# Patient Record
Sex: Male | Born: 1995 | Race: Black or African American | Hispanic: No | Marital: Single
Health system: Southern US, Community
[De-identification: ages and names within clinical notes are randomized; demographics above are authoritative.]

## PROBLEM LIST (undated history)

## (undated) DIAGNOSIS — J45909 Unspecified asthma, uncomplicated: Secondary | ICD-10-CM

## (undated) HISTORY — DX: Unspecified asthma, uncomplicated: J45.909

---
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
## 2016-08-06 ENCOUNTER — Ambulatory Visit (HOSPITAL_COMMUNITY): Admission: EM | Admit: 2016-08-06 | Discharge: 2016-08-06 | Discharge status: Absent without leave | Payer: Self-pay

## 2016-08-07 ENCOUNTER — Emergency Department (HOSPITAL_COMMUNITY)
Admission: EM | Admit: 2016-08-07 | Discharge: 2016-08-07 | Disposition: A | Discharge status: Home / Self Care | Payer: BC Managed Care – PPO | Attending: Emergency Medicine | Admitting: Emergency Medicine

## 2016-08-07 ENCOUNTER — Encounter (HOSPITAL_COMMUNITY): Payer: Self-pay | Admitting: Emergency Medicine

## 2016-08-07 ENCOUNTER — Emergency Department (HOSPITAL_COMMUNITY): Payer: BC Managed Care – PPO

## 2016-08-07 DIAGNOSIS — J45909 Unspecified asthma, uncomplicated: Secondary | ICD-10-CM | POA: Diagnosis not present

## 2016-08-07 DIAGNOSIS — I10 Essential (primary) hypertension: Secondary | ICD-10-CM | POA: Insufficient documentation

## 2016-08-07 DIAGNOSIS — J029 Acute pharyngitis, unspecified: Secondary | ICD-10-CM | POA: Diagnosis present

## 2016-08-07 DIAGNOSIS — Z7722 Contact with and (suspected) exposure to environmental tobacco smoke (acute) (chronic): Secondary | ICD-10-CM | POA: Diagnosis not present

## 2016-08-07 DIAGNOSIS — J039 Acute tonsillitis, unspecified: Secondary | ICD-10-CM | POA: Diagnosis not present

## 2016-08-07 DIAGNOSIS — E039 Hypothyroidism, unspecified: Secondary | ICD-10-CM | POA: Insufficient documentation

## 2016-08-07 DIAGNOSIS — B2790 Infectious mononucleosis, unspecified without complication: Secondary | ICD-10-CM | POA: Insufficient documentation

## 2016-08-07 LAB — BASIC METABOLIC PANEL
ANION GAP: 9 (ref 5–15)
BUN: 9 mg/dL (ref 6–20)
CALCIUM: 9.1 mg/dL (ref 8.9–10.3)
CO2: 23 mmol/L (ref 22–32)
Chloride: 106 mmol/L (ref 101–111)
Creatinine, Ser: 1.1 mg/dL (ref 0.61–1.24)
GLUCOSE: 125 mg/dL — AB (ref 65–99)
POTASSIUM: 4.8 mmol/L (ref 3.5–5.1)
Sodium: 138 mmol/L (ref 135–145)

## 2016-08-07 LAB — CBC WITH DIFFERENTIAL/PLATELET
BASOS ABS: 0.3 10*3/uL — AB (ref 0.0–0.1)
BASOS PCT: 2 %
EOS ABS: 0 10*3/uL (ref 0.0–0.7)
Eosinophils Relative: 0 %
HEMATOCRIT: 40 % (ref 39.0–52.0)
HEMOGLOBIN: 14.3 g/dL (ref 13.0–17.0)
LYMPHS PCT: 40 %
Lymphs Abs: 5.4 10*3/uL — ABNORMAL HIGH (ref 0.7–4.0)
MCH: 31.7 pg (ref 26.0–34.0)
MCHC: 35.8 g/dL (ref 30.0–36.0)
MCV: 88.7 fL (ref 78.0–100.0)
MONOS PCT: 6 %
Monocytes Absolute: 0.8 10*3/uL (ref 0.1–1.0)
NEUTROS PCT: 52 %
Neutro Abs: 6.9 10*3/uL (ref 1.7–7.7)
Platelets: 171 10*3/uL (ref 150–400)
RBC: 4.51 MIL/uL (ref 4.22–5.81)
RDW: 12.1 % (ref 11.5–15.5)
WBC: 13.4 10*3/uL — ABNORMAL HIGH (ref 4.0–10.5)

## 2016-08-07 LAB — I-STAT CHEM 8, ED
BUN: 13 mg/dL (ref 6–20)
CALCIUM ION: 1.15 mmol/L (ref 1.15–1.40)
CHLORIDE: 104 mmol/L (ref 101–111)
Creatinine, Ser: 1.2 mg/dL (ref 0.61–1.24)
GLUCOSE: 121 mg/dL — AB (ref 65–99)
HCT: 43 % (ref 39.0–52.0)
Hemoglobin: 14.6 g/dL (ref 13.0–17.0)
POTASSIUM: 4.4 mmol/L (ref 3.5–5.1)
Sodium: 140 mmol/L (ref 135–145)
TCO2: 25 mmol/L (ref 0–100)

## 2016-08-07 LAB — MONONUCLEOSIS SCREEN: MONO SCREEN: POSITIVE — AB

## 2016-08-07 LAB — RAPID STREP SCREEN (MED CTR MEBANE ONLY): Streptococcus, Group A Screen (Direct): NEGATIVE

## 2016-08-07 MED ORDER — NAPROXEN 500 MG PO TABS: 500.0000 mg | ORAL_TABLET | Freq: Two times a day (BID) | ORAL | 0 refills | Status: DC

## 2016-08-07 MED ORDER — ACETAMINOPHEN 325 MG PO TABS
650.0000 mg | ORAL_TABLET | Freq: Once | ORAL | Status: AC
  Administered 2016-08-07: 650 mg via ORAL
  Filled 2016-08-07: qty 2

## 2016-08-07 MED ORDER — SODIUM CHLORIDE 0.9 % IV BOLUS (SEPSIS)
1000.0000 mL | Freq: Once | INTRAVENOUS | Status: AC
  Administered 2016-08-07: 1000 mL via INTRAVENOUS

## 2016-08-07 MED ORDER — KETOROLAC TROMETHAMINE 30 MG/ML IJ SOLN
30.0000 mg | Freq: Once | INTRAMUSCULAR | Status: AC
  Administered 2016-08-07: 30 mg via INTRAMUSCULAR
  Filled 2016-08-07: qty 1

## 2016-08-07 MED ORDER — DEXAMETHASONE SODIUM PHOSPHATE 10 MG/ML IJ SOLN
10.0000 mg | Freq: Once | INTRAMUSCULAR | Status: AC
  Administered 2016-08-07: 10 mg via INTRAMUSCULAR
  Filled 2016-08-07: qty 1

## 2016-08-07 MED ORDER — IOPAMIDOL (ISOVUE-300) INJECTION 61%
INTRAVENOUS | Status: AC
  Administered 2016-08-07: 72 mL
  Filled 2016-08-07: qty 75

## 2016-08-07 NOTE — ED Triage Notes (Signed)
Pt being treated for tonsillitis one day with antibiotic [facility] and was given steroid. Pt reports difficultly swallowing. Tonsils are enlarged and have white spots on them, pt's airway intact.

## 2016-08-07 NOTE — ED Provider Notes (Signed)
[facility] Provider Note   CSN: [id number] Arrival date & time: 08/07/16  1434     History   Chief Complaint Chief Complaint  Patient presents with  . Sore Throat    HPI Ricky Mckenzie. is a 20 y.o. male.  Ricky Mckenzie. Is a 21 y.o. Male who presents to the ED with his father complaining of sore throat and trouble swallowing for the past week that has worsened today. He reports trouble swallowing and that he has been spitting into a basin. He was seen at urgent care in the past 5 days as well as [facility]. He believes he had a positive Monospot and may be a positive strep test. He is not sure about the exact medications that he received. Later, I was able to speak with the student health center who told me he received Solu-Medrol, Rocephin and a fluid bolus. She reports he had a positive Monospot, she is not sure about his strep test. Patient reports a fever of 101 earlier today. Patient denies neck pain, neck stiffness, ear pain, coughing, trouble breathing, abdominal pain, nausea, vomiting, diarrhea or rashes.   The history is provided by the patient. No language interpreter was used.  Sore Throat  Pertinent negatives include no chest pain, no abdominal pain, no headaches and no shortness of breath.    Past Medical History:  Diagnosis Date  . Asthma 2000    Patient Active Problem List   Diagnosis Date Noted  . 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 Goiter 06/02/2013  . 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 Essential hypertension, benign 06/02/2013    History reviewed. No pertinent surgical history.     Home Medications    Prior to Admission medications   Medication Sig Start Date End Date Taking? Authorizing Provider  loratadine (CLARITIN) 10 MG tablet Take 10 mg by mouth daily.    Historical Provider, MD  montelukast (SINGULAIR) 10 MG tablet Take 10 mg by mouth at bedtime.    Historical Provider, MD  naproxen  (NAPROSYN) 500 MG tablet Take 1 tablet (500 mg total) by mouth 2 (two) times daily with a meal. 08/07/16   Ricky Farrier, PA-C    Family History Family History  Problem Relation Age of Onset  . Hypertension Father   . Diabetes Paternal Grandmother   . Kidney disease Paternal Grandmother   . Diabetes Paternal Grandfather   . Kidney disease Paternal Grandfather   . Thyroid disease Brother     Social History Social History  Substance Use Topics  . Smoking status: Passive Smoke Exposure - Never Smoker  . Smokeless tobacco: Not on file  . Alcohol use Not on file     Allergies   Review of patient's allergies indicates no known allergies.   Review of Systems Review of Systems  Constitutional: Positive for fever.  HENT: Positive for drooling, sore throat, trouble swallowing and voice change. Negative for congestion, ear pain, facial swelling and mouth sores.   Eyes: Negative for visual disturbance.  Respiratory: Negative for cough and shortness of breath.   Cardiovascular: Negative for chest pain.  Gastrointestinal: Negative for abdominal pain, diarrhea, nausea and vomiting.  Genitourinary: Negative for dysuria.  Musculoskeletal: Negative for back pain and neck pain.  Skin: Negative for rash.  Neurological: Negative for syncope, light-headedness and headaches.     Physical Exam Updated Vital Signs BP 126/75 (BP Location: Right Arm)   Pulse 83   Temp 98.9 F (37.2 C) (Oral)  Resp 18   Ht 5\' 11"  (1.803 m)   Wt 86.2 kg   SpO2 98%   BMI 26.50 kg/m   Physical Exam  Constitutional: He appears well-developed and well-nourished. No distress.  Nontoxic appearing.  HENT:  Head: Normocephalic and atraumatic.  Right Ear: External ear normal.  Left Ear: External ear normal.  Mouth/Throat: Oropharyngeal exudate present.  Patient with nearly kissing tonsils. Significant tonsillar hypertrophy with exudates. Uvula is midline without edema. Oropharynx is patent. He is spitting  up some into a basin. No trismus. No evidence of peritonsillar abscess. Bilateral tympanic membranes are pearly-gray without erythema or loss of landmarks.   Eyes: Conjunctivae are normal. Pupils are equal, round, and reactive to light. Right eye exhibits no discharge. Left eye exhibits no discharge.  Neck: Normal range of motion. Neck supple. No JVD present. No tracheal deviation present.  No stridor.  Cardiovascular: Normal rate, regular rhythm, normal heart sounds and intact distal pulses.   Pulmonary/Chest: Effort normal and breath sounds normal. No stridor. No respiratory distress. He has no wheezes. He has no rales.  Abdominal: Soft. There is no tenderness.  Musculoskeletal: He exhibits no edema.  Lymphadenopathy:    He has no cervical adenopathy.  Neurological: He is alert. Coordination normal.  Skin: Skin is warm and dry. Capillary refill takes less than 2 seconds. No rash noted. He is not diaphoretic. No erythema. No pallor.  Psychiatric: He has a normal mood and affect. His behavior is normal.  Nursing note and vitals reviewed.    ED Treatments / Results  Labs (all labs ordered are listed, but only abnormal results are displayed) Labs Reviewed  MONONUCLEOSIS SCREEN - Abnormal; Notable for the following:       Result Value   Mono Screen POSITIVE (*)    All other components within normal limits  BASIC METABOLIC PANEL - Abnormal; Notable for the following:    Glucose, Bld 125 (*)    All other components within normal limits  CBC WITH DIFFERENTIAL/PLATELET - Abnormal; Notable for the following:    WBC 13.4 (*)    Lymphs Abs 5.4 (*)    Basophils Absolute 0.3 (*)    All other components within normal limits  I-STAT CHEM 8, ED - Abnormal; Notable for the following:    Glucose, Bld 121 (*)    All other components within normal limits  RAPID STREP SCREEN (NOT AT [facility])  CULTURE, GROUP A STREP [facility])    EKG  EKG Interpretation None       Radiology Ct Soft Tissue Neck W  Contrast  Result Date: 08/07/2016 CLINICAL DATA:  Throat swelling and pain. Dysphagia. Symptoms beginning four days ago and worsening. EXAM: CT NECK WITH CONTRAST TECHNIQUE: Multidetector CT imaging of the neck was performed using the standard protocol following the bolus administration of intravenous contrast. CONTRAST:  72mL ISOVUE-300 IOPAMIDOL (ISOVUE-300) INJECTION 61% COMPARISON:  None. FINDINGS: Pharynx and larynx: There is prominent symmetric enlargement of the nasopharyngeal soft tissues/ adenoids. There is also prominent symmetric enlargement of the palatine tonsils which demonstrate mildly heterogeneous, striated enhancement and contact one another in the midline. No peritonsillar abscess is identified. There is a small retropharyngeal effusion. The oropharyngeal airway remains patent. The larynx is unremarkable. Salivary glands: Submandibular and parotid glands are unremarkable. Thyroid: Unremarkable. Lymph nodes: Bilateral cervical lymphadenopathy is likely reactive. Right larger than left lateral retropharyngeal lymph nodes measure up to 1.6 cm in short axis. Submental lymph nodes measure up to 10 mm in short axis. Level  II lymph nodes measure up to 1.7 cm on the right and 1.8 cm on the left. There are an increased number of subcentimeter level III-V lymph nodes bilaterally. Vascular: Major vascular structures of the neck appear patent. Limited intracranial: Unremarkable. Visualized orbits: Unremarkable. Mastoids and visualized paranasal sinuses: Mild mucosal thickening in the left greater than right sphenoid sinuses with small amount of fluid/secretions on the left. Small right maxillary sinus mucous retention cyst. Clear mastoid air cells. Skeleton: Unremarkable. Upper chest: Clear lung apices. Partially visualized homogeneous soft tissue in the anterior mediastinum, likely thymus. Other: None. IMPRESSION: 1. Prominent bilateral tonsillar enlargement consistent with tonsillitis. No evidence of  peritonsillar abscess. 2. Small retropharyngeal effusion. 3. Reactive cervical lymphadenopathy. Electronically Signed   By: Sebastian Ache M.D.   On: 08/07/2016 18:02    Procedures Procedures (including critical care time)  Medications Ordered in ED Medications  dexamethasone (DECADRON) injection 10 mg (10 mg Intramuscular Given 08/07/16 1601)  ketorolac (TORADOL) 30 MG/ML injection 30 mg (30 mg Intramuscular Given 08/07/16 1602)  sodium chloride 0.9 % bolus 1,000 mL (0 mLs Intravenous Stopped 08/07/16 1812)  iopamidol (ISOVUE-300) 61 % injection (72 mLs  Contrast Given 08/07/16 1732)  acetaminophen (TYLENOL) tablet 650 mg (650 mg Oral Given 08/07/16 1838)     Initial Impression / Assessment and Plan / ED Course  I have reviewed the triage vital signs and the nursing notes.  Pertinent labs & imaging results that were available during my care of the patient were reviewed by me and considered in my medical decision making (see chart for details).  Clinical Course   This is a 21 y.o. Male who presents to the ED with his father complaining of sore throat and trouble swallowing for the past week that has worsened today. He reports trouble swallowing and that he has been spitting into a basin. He was seen at urgent care in the past 5 days as well as his student [facility] center. He believes he had a positive Monospot and may be a positive strep test. He is not sure about the exact medications that he received. Later, I was able to speak with the student health center who told me he received Solu-Medrol, Rocephin and a fluid bolus. She reports he had a positive Monospot, she is not sure about his strep test. Patient reports a fever of 101 earlier today. On exam the patient is afebrile. He has bilateral tonsillar hypertrophy with exudate. Uvula is midline without edema. He is spitting into a basin. No neck pain or stiffness. No trismus. Will obtain CT soft tissue and provide with medications. The patient  received Decadron and Toradol and he reports he was feeling better almost instantly. At recheck he is no longer drooling. His voice has returned normal. CBC is remarkable for leukocytosis with a white count of 13,000. Rapid strep is negative. Monospot is positive. CT soft tissue neck shows prominent bilateral tonsillar enlargement consistent with tonsillitis. No evidence of peritonsillar abscess. At recheck patient is still feeling much better. He is able to tolerate by mouth Tylenol and ginger ale without difficulty. His voice has returned normal. He has no drooling. He reports feeling much better and ready for discharge.  Will discharge with follow-up with ENT doctor [facility] I encouraged him to use a Cepacol antibacterial mouthwash as well as naproxen for pain control. I discussed the expected course and treatment of mono. I advised no contact sports until cleared back by his primary care doctor. I discussed strict and specific  return precautions. I provided him with a school note and reported that if he is not feeling better by the time he is due to return he should be re-seen in urgent care, or the emergency department. I advised that if any time he has difficulty swallowing or he is having drooling or difficulty moving his neck in its to return immediately to the emergency department. I advised the patient to follow-up with their primary care provider this week. I advised the patient to return to the emergency department with new or worsening symptoms or new concerns. The patient verbalized understanding and agreement with plan.     This patient was discussed with Dr. Jeraldine Loots who agrees with assessment and plan.   Final Clinical Impressions(s) / ED Diagnoses   Final diagnoses:  Tonsillitis  Mononucleosis    New Prescriptions New Prescriptions   NAPROXEN (NAPROSYN) 500 MG TABLET    Take 1 tablet (500 mg total) by mouth 2 (two) times daily with a meal.     Ricky Farrier, PA-C 08/07/16  1853    Gerhard Munch, MD 08/07/16 2352

## 2016-08-07 NOTE — Discharge Instructions (Signed)
Please use Cepacol antibacterial mouth wash twice a day. Swish and spit out.

## 2016-08-09 LAB — CULTURE, GROUP A STREP (THRC)

## 2018-03-07 ENCOUNTER — Ambulatory Visit (HOSPITAL_COMMUNITY)
Admission: EM | Admit: 2018-03-07 | Discharge: 2018-03-07 | Disposition: A | Discharge status: Home / Self Care | Payer: BC Managed Care – PPO | Attending: Family Medicine | Admitting: Family Medicine

## 2018-03-07 ENCOUNTER — Encounter (HOSPITAL_COMMUNITY): Payer: Self-pay | Admitting: Emergency Medicine

## 2018-03-07 DIAGNOSIS — H66002 Acute suppurative otitis media without spontaneous rupture of ear drum, left ear: Secondary | ICD-10-CM

## 2018-03-07 MED ORDER — FLUTICASONE PROPIONATE 50 MCG/ACT NA SUSP: 1.0000 | Freq: Every day | NASAL | 2 refills | Status: AC

## 2018-03-07 MED ORDER — AMOXICILLIN-POT CLAVULANATE 875-125 MG PO TABS: 1.0000 | ORAL_TABLET | Freq: Two times a day (BID) | ORAL | 0 refills | Status: AC

## 2018-03-07 NOTE — ED Triage Notes (Signed)
Pt states both his ears are clogged

## 2018-03-07 NOTE — Discharge Instructions (Signed)
Push fluids to ensure adequate hydration and keep secretions thin.  Tylenol and/or ibuprofen as needed for pain or fevers.  Complete course of antibiotics.  Daily flonase. If symptoms worsen or do not improve in the next week to return to be seen or to follow up with your PCP.

## 2018-03-07 NOTE — ED Provider Notes (Signed)
[facility]    CSN: [id number] Arrival date & time: 03/07/18  1816     History   Chief Complaint Chief Complaint  Patient presents with  . Otalgia    HPI Ricky Isabell. is a 22 y.o. male.   Ricky Mckenzie presents with complaints of head pressure, bilateral ear pain and pressure with difficulty hearing and ringing, productive cough and congestion. This started approximately 4 days ago. No fevers. No known ill contacts. Uses daily allergy medication. Hx of asthma but has not required treatment in years. No gi/gu complaints. No sore throat. Eyes have felt red without drainage.   ROS per HPI.      Past Medical History:  Diagnosis Date  . Asthma 2000    Patient Active Problem List   Diagnosis Date Noted  . 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 Goiter 06/02/2013  . 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 Essential hypertension, benign 06/02/2013    History reviewed. No pertinent surgical history.     Home Medications    Prior to Admission medications   Medication Sig Start Date End Date Taking? Authorizing Provider  citalopram (CELEXA) 10 MG tablet Take 10 mg by mouth daily.   Yes [provider]  amoxicillin-clavulanate (AUGMENTIN) 875-125 MG tablet Take 1 tablet by mouth every 12 (twelve) hours for 7 days. 03/07/18 03/14/18  Georgetta Haber, NP  fluticasone (FLONASE) 50 MCG/ACT nasal spray Place 1 spray into both nostrils daily. 03/07/18   Georgetta Haber, NP  loratadine (CLARITIN) 10 MG tablet Take 10 mg by mouth daily.    [provider]  montelukast (SINGULAIR) 10 MG tablet Take 10 mg by mouth at bedtime.    [provider]  naproxen (NAPROSYN) 500 MG tablet Take 1 tablet (500 mg total) by mouth 2 (two) times daily with a meal. 08/07/16   [name], PA-C    Family History Family History  Problem Relation Age of Onset  . Hypertension Father   . Diabetes Paternal Grandmother   . Kidney disease Paternal  Grandmother   . Diabetes Paternal Grandfather   . Kidney disease Paternal Grandfather   . Thyroid disease Brother     Social History Social History   Tobacco Use  . Smoking status: Passive Smoke Exposure - Never Smoker  Substance Use Topics  . Alcohol use: Not on file  . Drug use: Not on file     Allergies   Patient has no known allergies.   Review of Systems Review of Systems   Physical Exam Triage Vital Signs ED Triage Vitals [03/07/18 1837]  Enc Vitals Group     BP (!) 144/88     Pulse Rate 92     Resp 18     Temp 98.9 F (37.2 C)     Temp src      SpO2 100 %     Weight      Height      Head Circumference      Peak Flow      Pain Score      Pain Loc      Pain Edu?      Excl. in GC?    No data found.  Updated Vital Signs BP (!) 144/88   Pulse 92   Temp 98.9 F (37.2 C)   Resp 18   SpO2 100%    Physical Exam  Constitutional: He is oriented to person, place, and time. He appears well-developed and well-nourished.  HENT:  Head:  Normocephalic and atraumatic.  Right Ear: External ear and ear canal normal. Tympanic membrane is erythematous.  Left Ear: External ear and ear canal normal. Tympanic membrane is erythematous and bulging.  Nose: Nose normal. Right sinus exhibits no maxillary sinus tenderness and no frontal sinus tenderness. Left sinus exhibits no maxillary sinus tenderness and no frontal sinus tenderness.  Mouth/Throat: Uvula is midline, oropharynx is clear and moist and mucous membranes are normal.  Eyes: Pupils are equal, round, and reactive to light. Conjunctivae are normal.  Neck: Normal range of motion.  Cardiovascular: Normal rate and regular rhythm.  Pulmonary/Chest: Effort normal and breath sounds normal.  Lymphadenopathy:    He has no cervical adenopathy.  Neurological: He is alert and oriented to person, place, and time.  Skin: Skin is warm and dry.  Vitals reviewed.    UC Treatments / Results  Labs (all labs ordered are  listed, but only abnormal results are displayed) Labs Reviewed - No data to display  EKG None  Radiology No results found.  Procedures Procedures (including critical care time)  Medications Ordered in UC Medications - No data to display  Initial Impression / Assessment and Plan / UC Course  I have reviewed the triage vital signs and the nursing notes.  Pertinent labs & imaging results that were available during my care of the patient were reviewed by me and considered in my medical decision making (see chart for details).     Left Tm with OM. Right with mild erythema. Course of augmentin initiated. Push fluids. Continue with allergy medications, daily flonase. Return precautions provided. Patient verbalized understanding and agreeable to plan.    Final Clinical Impressions(s) / UC Diagnoses   Final diagnoses:  Acute suppurative otitis media of left ear without spontaneous rupture of tympanic membrane, recurrence not specified     Discharge Instructions     Push fluids to ensure adequate hydration and keep secretions thin.  Tylenol and/or ibuprofen as needed for pain or fevers.  Complete course of antibiotics.  Daily flonase. If symptoms worsen or do not improve in the next week to return to be seen or to follow up with your PCP.      ED Prescriptions    Medication Sig Dispense Auth. Provider   amoxicillin-clavulanate (AUGMENTIN) 875-125 MG tablet Take 1 tablet by mouth every 12 (twelve) hours for 7 days. 14 tablet Tessy Pawelski, Dorene Grebe B, NP   fluticasone (FLONASE) 50 MCG/ACT nasal spray Place 1 spray into both nostrils daily. 16 g Georgetta Haber, NP     Controlled Substance Prescriptions [place] Controlled Substance Registry consulted? Not Applicable   Georgetta Haber, NP 03/07/18 [phone]

## 2018-04-03 ENCOUNTER — Encounter (HOSPITAL_COMMUNITY): Payer: Self-pay | Admitting: Emergency Medicine

## 2018-04-03 ENCOUNTER — Observation Stay (HOSPITAL_COMMUNITY)
Admission: EM | Admit: 2018-04-03 | Discharge: 2018-04-04 | Disposition: A | Discharge status: Home / Self Care | Payer: BC Managed Care – PPO | Attending: Surgery | Admitting: Surgery

## 2018-04-03 ENCOUNTER — Emergency Department (HOSPITAL_COMMUNITY): Payer: BC Managed Care – PPO

## 2018-04-03 ENCOUNTER — Encounter (HOSPITAL_COMMUNITY)
Admission: EM | Disposition: A | Discharge status: Home / Self Care | Payer: Self-pay | Source: Home / Self Care | Attending: Emergency Medicine

## 2018-04-03 ENCOUNTER — Observation Stay (HOSPITAL_COMMUNITY): Payer: BC Managed Care – PPO | Admitting: Anesthesiology

## 2018-04-03 ENCOUNTER — Encounter (HOSPITAL_COMMUNITY): Payer: Self-pay

## 2018-04-03 ENCOUNTER — Ambulatory Visit (HOSPITAL_COMMUNITY)
Admission: EM | Admit: 2018-04-03 | Discharge: 2018-04-03 | Disposition: A | Discharge status: Home / Self Care | Payer: BC Managed Care – PPO | Source: Home / Self Care | Attending: Internal Medicine | Admitting: Internal Medicine

## 2018-04-03 ENCOUNTER — Other Ambulatory Visit: Payer: Self-pay

## 2018-04-03 DIAGNOSIS — R1031 Right lower quadrant pain: Secondary | ICD-10-CM

## 2018-04-03 DIAGNOSIS — K352 Acute appendicitis with generalized peritonitis, without abscess: Principal | ICD-10-CM | POA: Insufficient documentation

## 2018-04-03 DIAGNOSIS — K37 Unspecified appendicitis: Secondary | ICD-10-CM | POA: Diagnosis present

## 2018-04-03 DIAGNOSIS — Z7722 Contact with and (suspected) exposure to environmental tobacco smoke (acute) (chronic): Secondary | ICD-10-CM | POA: Diagnosis not present

## 2018-04-03 DIAGNOSIS — K3580 Unspecified acute appendicitis: Secondary | ICD-10-CM

## 2018-04-03 DIAGNOSIS — Z79899 Other long term (current) drug therapy: Secondary | ICD-10-CM | POA: Diagnosis not present

## 2018-04-03 DIAGNOSIS — J45909 Unspecified asthma, uncomplicated: Secondary | ICD-10-CM | POA: Diagnosis not present

## 2018-04-03 DIAGNOSIS — E039 Hypothyroidism, unspecified: Secondary | ICD-10-CM | POA: Diagnosis not present

## 2018-04-03 HISTORY — PX: APPENDECTOMY: SHX54

## 2018-04-03 HISTORY — PX: LAPAROSCOPIC APPENDECTOMY: SHX408

## 2018-04-03 LAB — URINALYSIS, ROUTINE W REFLEX MICROSCOPIC
Bilirubin Urine: NEGATIVE
GLUCOSE, UA: NEGATIVE mg/dL
HGB URINE DIPSTICK: NEGATIVE
KETONES UR: 5 mg/dL — AB
LEUKOCYTES UA: NEGATIVE
Nitrite: NEGATIVE
PROTEIN: NEGATIVE mg/dL
Specific Gravity, Urine: 1.028 (ref 1.005–1.030)
pH: 5 (ref 5.0–8.0)

## 2018-04-03 LAB — COMPREHENSIVE METABOLIC PANEL
ALBUMIN: 4.6 g/dL (ref 3.5–5.0)
ALK PHOS: 59 U/L (ref 38–126)
ALT: 37 U/L (ref 17–63)
ANION GAP: 11 (ref 5–15)
AST: 27 U/L (ref 15–41)
BILIRUBIN TOTAL: 1.3 mg/dL — AB (ref 0.3–1.2)
BUN: 13 mg/dL (ref 6–20)
CALCIUM: 9.5 mg/dL (ref 8.9–10.3)
CO2: 26 mmol/L (ref 22–32)
Chloride: 101 mmol/L (ref 101–111)
Creatinine, Ser: 1.12 mg/dL (ref 0.61–1.24)
GFR calc Af Amer: >60 mL/min (ref >60)
Glucose, Bld: 87 mg/dL (ref 65–99)
Potassium: 4.1 mmol/L (ref 3.5–5.1)
Sodium: 138 mmol/L (ref 135–145)
TOTAL PROTEIN: 8.5 g/dL — AB (ref 6.5–8.1)

## 2018-04-03 LAB — CBC
HCT: 43.5 % (ref 39.0–52.0)
Hemoglobin: 15.3 g/dL (ref 13.0–17.0)
MCH: 31.5 pg (ref 26.0–34.0)
MCHC: 35.2 g/dL (ref 30.0–36.0)
MCV: 89.7 fL (ref 78.0–100.0)
Platelets: 222 10*3/uL (ref 150–400)
RBC: 4.85 MIL/uL (ref 4.22–5.81)
RDW: 11 % — AB (ref 11.5–15.5)
WBC: 10.8 10*3/uL — ABNORMAL HIGH (ref 4.0–10.5)

## 2018-04-03 LAB — LIPASE, BLOOD: Lipase: 24 U/L (ref 11–51)

## 2018-04-03 SURGERY — APPENDECTOMY, LAPAROSCOPIC
Anesthesia: General | Site: Abdomen

## 2018-04-03 MED ORDER — 0.9 % SODIUM CHLORIDE (POUR BTL) OPTIME
TOPICAL | Status: DC | PRN
  Administered 2018-04-03: 1000 mL

## 2018-04-03 MED ORDER — BUPIVACAINE-EPINEPHRINE 0.25% -1:200000 IJ SOLN
INTRAMUSCULAR | Status: DC | PRN
  Administered 2018-04-03: 50 mL

## 2018-04-03 MED ORDER — LIDOCAINE HCL 4 % MT SOLN
OROMUCOSAL | Status: DC | PRN
  Administered 2018-04-03: 4 mL via TOPICAL

## 2018-04-03 MED ORDER — ROCURONIUM BROMIDE 50 MG/5ML IV SOLN
INTRAVENOUS | Status: AC
  Filled 2018-04-03: qty 2

## 2018-04-03 MED ORDER — SUGAMMADEX SODIUM 500 MG/5ML IV SOLN
INTRAVENOUS | Status: AC
  Filled 2018-04-03: qty 5

## 2018-04-03 MED ORDER — HYDRALAZINE HCL 20 MG/ML IJ SOLN
10.0000 mg | INTRAMUSCULAR | Status: DC | PRN
Start: 2018-04-03 — End: 2018-04-04

## 2018-04-03 MED ORDER — DIPHENHYDRAMINE HCL 50 MG/ML IJ SOLN
INTRAMUSCULAR | Status: AC
  Filled 2018-04-03: qty 1

## 2018-04-03 MED ORDER — ONDANSETRON HCL 4 MG/2ML IJ SOLN
INTRAMUSCULAR | Status: DC | PRN
  Administered 2018-04-03: 4 mg via INTRAVENOUS

## 2018-04-03 MED ORDER — LACTATED RINGERS IV SOLN
INTRAVENOUS | Status: DC | PRN
  Administered 2018-04-03 (×2): via INTRAVENOUS

## 2018-04-03 MED ORDER — DOCUSATE SODIUM 100 MG PO CAPS
100.0000 mg | ORAL_CAPSULE | Freq: Two times a day (BID) | ORAL | Status: DC
  Administered 2018-04-03: 100 mg via ORAL
  Filled 2018-04-03: qty 1

## 2018-04-03 MED ORDER — ARTIFICIAL TEARS OPHTHALMIC OINT
TOPICAL_OINTMENT | OPHTHALMIC | Status: DC | PRN
  Administered 2018-04-03: 1 via OPHTHALMIC

## 2018-04-03 MED ORDER — MIDAZOLAM HCL 2 MG/2ML IJ SOLN
INTRAMUSCULAR | Status: AC
  Filled 2018-04-03: qty 2

## 2018-04-03 MED ORDER — PROPOFOL 10 MG/ML IV BOLUS
INTRAVENOUS | Status: AC
  Filled 2018-04-03: qty 20

## 2018-04-03 MED ORDER — DIPHENHYDRAMINE HCL 50 MG/ML IJ SOLN
INTRAMUSCULAR | Status: DC | PRN
  Administered 2018-04-03: 25 mg via INTRAVENOUS

## 2018-04-03 MED ORDER — SUGAMMADEX SODIUM 200 MG/2ML IV SOLN
INTRAVENOUS | Status: DC | PRN
  Administered 2018-04-03: 300 mg via INTRAVENOUS

## 2018-04-03 MED ORDER — FENTANYL CITRATE (PF) 250 MCG/5ML IJ SOLN
INTRAMUSCULAR | Status: AC
  Filled 2018-04-03: qty 5

## 2018-04-03 MED ORDER — PROPOFOL 10 MG/ML IV BOLUS
INTRAVENOUS | Status: DC | PRN
  Administered 2018-04-03: 200 mg via INTRAVENOUS

## 2018-04-03 MED ORDER — LIDOCAINE HCL (CARDIAC) PF 100 MG/5ML IV SOSY
PREFILLED_SYRINGE | INTRAVENOUS | Status: DC | PRN
  Administered 2018-04-03: 80 mg via INTRAVENOUS

## 2018-04-03 MED ORDER — HYDROMORPHONE HCL 1 MG/ML IJ SOLN: 0.2500 mg | INTRAMUSCULAR | Status: DC | PRN

## 2018-04-03 MED ORDER — METOPROLOL TARTRATE 5 MG/5ML IV SOLN: 5.0000 mg | Freq: Four times a day (QID) | INTRAVENOUS | Status: DC | PRN

## 2018-04-03 MED ORDER — HYDROMORPHONE HCL 1 MG/ML IJ SOLN
INTRAMUSCULAR | Status: AC
  Filled 2018-04-03: qty 1

## 2018-04-03 MED ORDER — SODIUM CHLORIDE 0.9 % IV SOLN
INTRAVENOUS | Status: DC
  Administered 2018-04-03: 125 mL/h via INTRAVENOUS
  Administered 2018-04-04: 06:00:00 via INTRAVENOUS

## 2018-04-03 MED ORDER — BUPIVACAINE-EPINEPHRINE 0.25% -1:200000 IJ SOLN
INTRAMUSCULAR | Status: AC
  Filled 2018-04-03: qty 1

## 2018-04-03 MED ORDER — ONDANSETRON HCL 4 MG/2ML IJ SOLN: 4.0000 mg | Freq: Four times a day (QID) | INTRAMUSCULAR | Status: DC | PRN

## 2018-04-03 MED ORDER — SODIUM CHLORIDE 0.9 % IV SOLN
2.0000 g | Freq: Once | INTRAVENOUS | Status: AC
  Administered 2018-04-03: 2 g via INTRAVENOUS
  Filled 2018-04-03: qty 20

## 2018-04-03 MED ORDER — DIPHENHYDRAMINE HCL 50 MG/ML IJ SOLN: 25.0000 mg | Freq: Four times a day (QID) | INTRAMUSCULAR | Status: DC | PRN

## 2018-04-03 MED ORDER — ACETAMINOPHEN 500 MG PO TABS
1000.0000 mg | ORAL_TABLET | Freq: Four times a day (QID) | ORAL | Status: DC
  Administered 2018-04-03 – 2018-04-04 (×3): 1000 mg via ORAL
  Filled 2018-04-03 (×3): qty 2

## 2018-04-03 MED ORDER — PHENYLEPHRINE 40 MCG/ML (10ML) SYRINGE FOR IV PUSH (FOR BLOOD PRESSURE SUPPORT)
PREFILLED_SYRINGE | INTRAVENOUS | Status: AC
  Filled 2018-04-03: qty 10

## 2018-04-03 MED ORDER — TRAMADOL HCL 50 MG PO TABS
50.0000 mg | ORAL_TABLET | Freq: Four times a day (QID) | ORAL | Status: DC | PRN
  Administered 2018-04-04: 50 mg via ORAL
  Filled 2018-04-03: qty 1

## 2018-04-03 MED ORDER — EPHEDRINE SULFATE 50 MG/ML IJ SOLN
INTRAMUSCULAR | Status: AC
  Filled 2018-04-03: qty 1

## 2018-04-03 MED ORDER — LIDOCAINE 2% (20 MG/ML) 5 ML SYRINGE
INTRAMUSCULAR | Status: AC
  Filled 2018-04-03: qty 10

## 2018-04-03 MED ORDER — KETOROLAC TROMETHAMINE 30 MG/ML IJ SOLN
INTRAMUSCULAR | Status: AC
  Filled 2018-04-03: qty 1

## 2018-04-03 MED ORDER — HYDROMORPHONE HCL 2 MG/ML IJ SOLN: 0.5000 mg | INTRAMUSCULAR | Status: DC | PRN

## 2018-04-03 MED ORDER — SODIUM CHLORIDE 0.9 % IR SOLN
Status: DC | PRN
  Administered 2018-04-03: 1000 mL

## 2018-04-03 MED ORDER — SUGAMMADEX SODIUM 200 MG/2ML IV SOLN
INTRAVENOUS | Status: AC
  Filled 2018-04-03: qty 2

## 2018-04-03 MED ORDER — HYDROMORPHONE HCL 1 MG/ML IJ SOLN
0.5000 mg | INTRAMUSCULAR | Status: DC | PRN
  Administered 2018-04-03: 0.5 mg via INTRAVENOUS

## 2018-04-03 MED ORDER — KETOROLAC TROMETHAMINE 30 MG/ML IJ SOLN: 30.0000 mg | Freq: Four times a day (QID) | INTRAMUSCULAR | Status: DC | PRN

## 2018-04-03 MED ORDER — BISACODYL 10 MG RE SUPP: 10.0000 mg | Freq: Every day | RECTAL | Status: DC | PRN

## 2018-04-03 MED ORDER — MIDAZOLAM HCL 5 MG/5ML IJ SOLN
INTRAMUSCULAR | Status: DC | PRN
  Administered 2018-04-03: 2 mg via INTRAVENOUS

## 2018-04-03 MED ORDER — ONDANSETRON HCL 4 MG/2ML IJ SOLN
INTRAMUSCULAR | Status: AC
  Filled 2018-04-03: qty 2

## 2018-04-03 MED ORDER — SIMETHICONE 80 MG PO CHEW: 40.0000 mg | CHEWABLE_TABLET | Freq: Four times a day (QID) | ORAL | Status: DC | PRN

## 2018-04-03 MED ORDER — ENOXAPARIN SODIUM 40 MG/0.4ML ~~LOC~~ SOLN: 40.0000 mg | SUBCUTANEOUS | Status: DC

## 2018-04-03 MED ORDER — PROMETHAZINE HCL 25 MG/ML IJ SOLN: 6.2500 mg | INTRAMUSCULAR | Status: DC | PRN

## 2018-04-03 MED ORDER — METRONIDAZOLE IN NACL 5-0.79 MG/ML-% IV SOLN
500.0000 mg | Freq: Three times a day (TID) | INTRAVENOUS | Status: DC
  Administered 2018-04-03 – 2018-04-04 (×2): 500 mg via INTRAVENOUS
  Filled 2018-04-03 (×6): qty 100

## 2018-04-03 MED ORDER — ONDANSETRON 4 MG PO TBDP: 4.0000 mg | ORAL_TABLET | Freq: Four times a day (QID) | ORAL | Status: DC | PRN

## 2018-04-03 MED ORDER — METHOCARBAMOL 500 MG PO TABS
500.0000 mg | ORAL_TABLET | Freq: Four times a day (QID) | ORAL | Status: DC | PRN
  Administered 2018-04-03 – 2018-04-04 (×2): 500 mg via ORAL
  Filled 2018-04-03 (×2): qty 1

## 2018-04-03 MED ORDER — SODIUM CHLORIDE 0.9 % IV SOLN
2.0000 g | INTRAVENOUS | Status: DC
  Filled 2018-04-03: qty 20

## 2018-04-03 MED ORDER — KETOROLAC TROMETHAMINE 30 MG/ML IJ SOLN
30.0000 mg | Freq: Four times a day (QID) | INTRAMUSCULAR | Status: DC
  Administered 2018-04-03 – 2018-04-04 (×3): 30 mg via INTRAVENOUS
  Filled 2018-04-03 (×2): qty 1

## 2018-04-03 MED ORDER — ARTIFICIAL TEARS OPHTHALMIC OINT
TOPICAL_OINTMENT | OPHTHALMIC | Status: AC
  Filled 2018-04-03: qty 3.5

## 2018-04-03 MED ORDER — ROCURONIUM BROMIDE 100 MG/10ML IV SOLN
INTRAVENOUS | Status: DC | PRN
  Administered 2018-04-03: 40 mg via INTRAVENOUS

## 2018-04-03 MED ORDER — LACTATED RINGERS IV SOLN
INTRAVENOUS | Status: DC
  Administered 2018-04-03: 18:00:00 via INTRAVENOUS

## 2018-04-03 MED ORDER — DIPHENHYDRAMINE HCL 25 MG PO CAPS
25.0000 mg | ORAL_CAPSULE | Freq: Four times a day (QID) | ORAL | Status: DC | PRN
Start: 2018-04-03 — End: 2018-04-04

## 2018-04-03 MED ORDER — METRONIDAZOLE IN NACL 5-0.79 MG/ML-% IV SOLN
500.0000 mg | Freq: Once | INTRAVENOUS | Status: AC
  Administered 2018-04-03: 500 mg via INTRAVENOUS
  Filled 2018-04-03: qty 100

## 2018-04-03 MED ORDER — SODIUM CHLORIDE 0.9 % IJ SOLN
INTRAMUSCULAR | Status: AC
  Filled 2018-04-03: qty 10

## 2018-04-03 MED ORDER — FENTANYL CITRATE (PF) 100 MCG/2ML IJ SOLN
INTRAMUSCULAR | Status: DC | PRN
  Administered 2018-04-03: 50 ug via INTRAVENOUS
  Administered 2018-04-03: 100 ug via INTRAVENOUS
  Administered 2018-04-03 (×2): 50 ug via INTRAVENOUS

## 2018-04-03 MED ORDER — IOHEXOL 300 MG/ML  SOLN
100.0000 mL | Freq: Once | INTRAMUSCULAR | Status: AC | PRN
  Administered 2018-04-03: 100 mL via INTRAVENOUS

## 2018-04-03 SURGICAL SUPPLY — 43 items
ADH SKN CLS APL DERMABOND .7 (GAUZE/BANDAGES/DRESSINGS) ×1
APPLIER CLIP 5 13 M/L LIGAMAX5 (MISCELLANEOUS)
APR CLP MED LRG 5 ANG JAW (MISCELLANEOUS)
BAG SPEC RTRVL LRG 6X4 10 (ENDOMECHANICALS) ×1
BLADE CLIPPER SURG (BLADE) ×2 IMPLANT
CANISTER SUCT 3000ML PPV (MISCELLANEOUS) ×3 IMPLANT
CHLORAPREP W/TINT 26ML (MISCELLANEOUS) ×3 IMPLANT
CLIP APPLIE 5 13 M/L LIGAMAX5 (MISCELLANEOUS) IMPLANT
COVER SURGICAL LIGHT HANDLE (MISCELLANEOUS) ×3 IMPLANT
CUTTER ENDO LINEAR 45M (STAPLE) ×3 IMPLANT
DERMABOND ADVANCED (GAUZE/BANDAGES/DRESSINGS) ×2
DERMABOND ADVANCED .7 DNX12 (GAUZE/BANDAGES/DRESSINGS) ×1 IMPLANT
DEVICE PMI PUNCTURE CLOSURE (MISCELLANEOUS) ×3 IMPLANT
ELECT REM PT RETURN 9FT ADLT (ELECTROSURGICAL) ×3
ELECTRODE REM PT RTRN 9FT ADLT (ELECTROSURGICAL) ×1 IMPLANT
GLOVE BIO SURGEON STRL SZ 6 (GLOVE) ×3 IMPLANT
GLOVE INDICATOR 6.5 STRL GRN (GLOVE) ×3 IMPLANT
GOWN STRL REUS W/ TWL LRG LVL3 (GOWN DISPOSABLE) ×3 IMPLANT
GOWN STRL REUS W/TWL LRG LVL3 (GOWN DISPOSABLE) ×9
KIT BASIN OR (CUSTOM PROCEDURE TRAY) ×3 IMPLANT
KIT TURNOVER KIT B (KITS) ×3 IMPLANT
NDL INSUFFLATION 14GA 120MM (NEEDLE) ×1 IMPLANT
NEEDLE INSUFFLATION 14GA 120MM (NEEDLE) ×3 IMPLANT
NS IRRIG 1000ML POUR BTL (IV SOLUTION) ×3 IMPLANT
PAD ARMBOARD 7.5X6 YLW CONV (MISCELLANEOUS) ×6 IMPLANT
POUCH SPECIMEN RETRIEVAL 10MM (ENDOMECHANICALS) ×3 IMPLANT
RELOAD 45 VASCULAR/THIN (ENDOMECHANICALS) ×3 IMPLANT
RELOAD STAPLE 45 2.5 WHT GRN (ENDOMECHANICALS) IMPLANT
RELOAD STAPLE 45 3.5 BLU ETS (ENDOMECHANICALS) IMPLANT
RELOAD STAPLE TA45 3.5 REG BLU (ENDOMECHANICALS) IMPLANT
SCISSORS ENDO CVD 5DCS (MISCELLANEOUS) IMPLANT
SET IRRIG TUBING LAPAROSCOPIC (IRRIGATION / IRRIGATOR) ×3 IMPLANT
SHEARS HARMONIC ACE PLUS 36CM (ENDOMECHANICALS) ×2 IMPLANT
SLEEVE ENDOPATH XCEL 5M (ENDOMECHANICALS) ×3 IMPLANT
SPECIMEN JAR SMALL (MISCELLANEOUS) ×3 IMPLANT
SUT MNCRL AB 4-0 PS2 18 (SUTURE) ×3 IMPLANT
TOWEL OR 17X24 6PK STRL BLUE (TOWEL DISPOSABLE) ×3 IMPLANT
TRAY FOLEY CATH SILVER 16FR (SET/KITS/TRAYS/PACK) ×3 IMPLANT
TRAY LAPAROSCOPIC MC (CUSTOM PROCEDURE TRAY) ×3 IMPLANT
TROCAR XCEL 12X100 BLDLESS (ENDOMECHANICALS) ×3 IMPLANT
TROCAR XCEL NON-BLD 5MMX100MML (ENDOMECHANICALS) ×3 IMPLANT
TUBING INSUFFLATION (TUBING) ×3 IMPLANT
WATER STERILE IRR 1000ML POUR (IV SOLUTION) ×3 IMPLANT

## 2018-04-03 NOTE — Op Note (Signed)
Operative Report  Ricky Kaufmannony B Ramseur Jr. 22 y.o. male  [id number]  [id number]  04/03/2018  Surgeon: Berna Buehelsea A Atwood Adcock   Assistant: none  Procedure performed: Laparoscopic Appendectomy  Preop diagnosis: Acute appendicitis - with generalized peritonitis  Post-op diagnosis/intraop findings: Acute appendicitis with purulent fluid in pelvis  Specimens: appendix  EBL: minimal  Complications: none  Description of procedure: After obtaining informed consent the patient was brought to the operating room. Antibiotics were administered. SCD's were applied. General endotracheal anesthesia was initiated and a formal time-out was performed. Foley catheter was inserted. The abdomen was prepped and draped in the usual sterile fashion and the abdomen was entered using an infraumbilical Veress needle and insufflated to 15 mmHg. A 5 mm trocar and camera were then introduced, the abdomen was inspected and there is no evidence of injury from our entry. A suprapubic 5 mm trocar and a left lower quadrant 12 mm trocar were introduced under direct visualization following infiltration with local. The patient was then placed in Trendelenburg and rotated to the left and the small bowel was reflected cephalad. The appendix was visualized: the distal half was dilated and acutely inflamed, there was no perforation but purulence was present around the appendix and pooled in the pelvis. Omental adhesions to the appendix were bluntly removed. The appendix was retracted anteriorly and inferiorly. A window in the mesoappendix was created bluntly at the base of the appendix and a white load linear cutting stapler was used to transect the appendix from the cecum, taking a small cuff of healthy cecum as well. The harmonic scalpel was then used to transect the appendiceal mesentery. Hemostasis was ensured. The appendix was placed in an Endo Catch bag and removed through our 12 mm trocar. The purulent fluid was aspirated and the  right lower quadrant and pelvis were irrigated; the effluent was clear. The small bowel was run from the ileocecal valve proximally several feet and no other abnormality was present. The 12mm trocar site in the left lower quadrant was closed with a 0 vicryl in the fascia under direct visualization using a PMI device. The abdomen was desufflated and all trocars removed. The skin incisions were closed with running subcuticular monocryl and Dermabond. The patient was awakened, extubated and transported to the recovery room in stable condition.   All counts were correct at the completion of the case.

## 2018-04-03 NOTE — ED Notes (Signed)
Patient transported to CT 

## 2018-04-03 NOTE — ED Triage Notes (Addendum)
Pt endorses RLQ pain since yesterday with nausea, no vomiting and no diarrhea. LBM yesterday. VSS. [facility] Also had some dysuria last week that resolved.

## 2018-04-03 NOTE — Discharge Instructions (Addendum)
Recommend further evaluation and treatment at the ED Cannot rule out appendicitis based on patients symptoms  Patient aware and in agreement with this plan Going by private vehicle in stable condition to ED

## 2018-04-03 NOTE — ED Notes (Signed)
Cannot provide UA at this time.

## 2018-04-03 NOTE — Anesthesia Preprocedure Evaluation (Addendum)
Anesthesia Evaluation  Patient identified by MRN, date of birth, ID band Patient awake    Reviewed: Allergy & Precautions, NPO status , Patient's Chart, lab work & pertinent test results  Airway Mallampati: II  TM Distance: >3 FB Neck ROM: Full    Dental  (+) Dental Advisory Given   Pulmonary asthma ,    breath sounds clear to auscultation       Cardiovascular negative cardio ROS   Rhythm:Regular Rate:Normal     Neuro/Psych negative neurological ROS     GI/Hepatic Neg liver ROS, Acute appendicitis   Endo/Other  negative endocrine ROSHypothyroidism   Renal/GU negative Renal ROS     Musculoskeletal   Abdominal   Peds  Hematology negative hematology ROS (+)   Anesthesia Other Findings   Reproductive/Obstetrics                            Lab Results  Component Value Date   WBC 10.8 (H) 04/03/2018   HGB 15.3 04/03/2018   HCT 43.5 04/03/2018   MCV 89.7 04/03/2018   PLT 222 04/03/2018   Lab Results  Component Value Date   CREATININE 1.12 04/03/2018   BUN 13 04/03/2018   NA 138 04/03/2018   K 4.1 04/03/2018   CL 101 04/03/2018   CO2 26 04/03/2018    Anesthesia Physical Anesthesia Plan  ASA: II  Anesthesia Plan: General   Post-op Pain Management:    Induction: Intravenous  PONV Risk Score and Plan: 2 and Ondansetron, Dexamethasone and Treatment may vary due to age or medical condition  Airway Management Planned: Oral ETT  Additional Equipment:   Intra-op Plan:   Post-operative Plan: Extubation in OR  Informed Consent: I have reviewed the patients History and Physical, chart, labs and discussed the procedure including the risks, benefits and alternatives for the proposed anesthesia with the patient or authorized representative who has indicated his/her understanding and acceptance.   Dental advisory given  Plan Discussed with: CRNA, Anesthesiologist and  Surgeon  Anesthesia Plan Comments:        Anesthesia Quick Evaluation

## 2018-04-03 NOTE — Progress Notes (Signed)
Ricky Kaufmannony B Storer Jr. is a 22 y.o. male patient admitted from PACU awake, alert - oriented  X 4 - no acute distress noted.  VSS - Blood pressure 133/86, pulse 74, temperature 98.2 F (36.8 C), temperature source Oral, resp. rate 16, height 6' (1.829 m), weight 83.9 kg (185 lb), SpO2 98 %.    IV in place, occlusive dsg intact without redness.  Surgical incision on abdomen C/D/I. Pt's dad at bedside. Oriented to room.  Will cont to eval and treat per MD orders.  Ricky PorterJosephine M Rashun Grattan, RN 04/03/2018 10:53 PM

## 2018-04-03 NOTE — ED Triage Notes (Signed)
Abdominal pain started yesterday afternoon.  Patient has nausea, no vomiting.  No diarrhea.  Tender all over,but more significantly on right abdomen

## 2018-04-03 NOTE — Anesthesia Procedure Notes (Signed)
Procedure Name: Intubation Date/Time: 04/03/2018 8:13 PM Performed by: Clovis Cao, CRNA Pre-anesthesia Checklist: Patient identified, Emergency Drugs available, Suction available, Patient being monitored and Timeout performed Patient Re-evaluated:Patient Re-evaluated prior to induction Oxygen Delivery Method: Circle system utilized Preoxygenation: Pre-oxygenation with 100% oxygen Induction Type: IV induction Ventilation: Mask ventilation without difficulty Laryngoscope Size: Miller and 2 Grade View: Grade I Tube type: Oral Tube size: 7.5 mm Number of attempts: 1 Airway Equipment and Method: Stylet and LTA kit utilized Placement Confirmation: ETT inserted through vocal cords under direct vision,  positive ETCO2 and breath sounds checked- equal and bilateral Secured at: 23 cm Tube secured with: Tape Dental Injury: Teeth and Oropharynx as per pre-operative assessment

## 2018-04-03 NOTE — ED Provider Notes (Signed)
Patient placed in Quick Look pathway, seen and evaluated   Chief Complaint: RLQ abdominal pain  HPI:   22 year old male presents with RLQ abdominal pain starting yesterday. [facility] to [facility] ED to r/o appendicitis. The pain is sharp. He's had some dysuria over that past week and nausea. Pain is worse with sitting up and walking. No fever, chills, vomiting, diarrhea, constipation, penile discharge or testicular pain. No prior abdominal surgeries. NPO since this morning ~8AM  ROS: +abdominal pain, +nausea  Physical Exam:   Gen: No distress  Neuro: Awake and Alert  Skin: Warm    Focused Exam: Abdomen: Soft, tender in RLQ. +Rovsing, +Rebound   Initiation of care has begun. The patient has been counseled on the process, plan, and necessity for staying for the completion/evaluation, and the remainder of the medical screening examination     Bethel BornGekas, Eymi Lipuma Marie, PA-C 04/03/18 1412    Jacalyn LefevreHaviland, Julie, MD 04/03/18 1551

## 2018-04-03 NOTE — ED Provider Notes (Signed)
[facility]   [id number] 04/03/18 Arrival Time: 1157  SUBJECTIVE:  Ricky Kaufmannony B Maddy Jr. is a 22 y.o. male who presents with complaint of abdominal discomfort that began abruptly yesterday.  Denies a precipitating event, or trauma.  Denies recent travel, strenous activity, eating anything out of the ordinary, or close contacts with similar symptoms.  Pain is diffuse RLQ.  Describes as intermittent and sharp in character.  Has not tried OTC medications.  Worse with movement.  Denies similar symptoms in the past.  Last BM yesterday morning normal for patient. Complains of associated decreased appetite, and nausea.  Denies fever, chills, weight changes, vomiting, chest pain, SOB, diarrhea, constipation, hematochezia, melena, dysuria, difficulty urinating, increased frequency or urgency, flank pain, loss of bowel or bladder function.  No LMP for male patient.  ROS: As per HPI.  Past Medical History:  Diagnosis Date  . Asthma 2000   History reviewed. No pertinent surgical history. No Known Allergies No current facility-administered medications on file prior to encounter.    Current Outpatient Medications on File Prior to Encounter  Medication Sig Dispense Refill  . citalopram (CELEXA) 10 MG tablet Take 10 mg by mouth daily.    . fluticasone (FLONASE) 50 MCG/ACT nasal spray Place 1 spray into both nostrils daily. 16 g 2  . loratadine (CLARITIN) 10 MG tablet Take 10 mg by mouth daily.    . montelukast (SINGULAIR) 10 MG tablet Take 10 mg by mouth at bedtime.    . naproxen (NAPROSYN) 500 MG tablet Take 1 tablet (500 mg total) by mouth 2 (two) times daily with a meal. 30 tablet 0   Social History   Socioeconomic History  . Marital status: Single    Spouse name: Not on file  . Number of children: Not on file  . Years of education: Not on file  . Highest education level: Not on file  Occupational History  . Not on file  Social Needs  . Financial resource strain: Not on file    . Food insecurity:    Worry: Not on file    Inability: Not on file  . Transportation needs:    Medical: Not on file    Non-medical: Not on file  Tobacco Use  . Smoking status: Passive Smoke Exposure - Never Smoker  Substance and Sexual Activity  . Alcohol use: Yes  . Drug use: Never  . Sexual activity: Not on file  Lifestyle  . Physical activity:    Days per week: Not on file    Minutes per session: Not on file  . Stress: Not on file  Relationships  . Social connections:    Talks on phone: Not on file    Gets together: Not on file    Attends religious service: Not on file    Active member of club or organization: Not on file    Attends meetings of clubs or organizations: Not on file    Relationship status: Not on file  . Intimate partner violence:    Fear of current or ex partner: Not on file    Emotionally abused: Not on file    Physically abused: Not on file    Forced sexual activity: Not on file  Other Topics Concern  . Not on file  Social History Narrative   Lives with dad and two sibblings attends Early College at [employer] will start 11th grade.   Family History  Problem Relation Age of Onset  . Hypertension Father   .  Diabetes Paternal Grandmother   . Kidney disease Paternal Grandmother   . Diabetes Paternal Grandfather   . Kidney disease Paternal Grandfather   . Thyroid disease Brother      OBJECTIVE:  Vitals:   04/03/18 1303  BP: 132/82  Pulse: 65  Resp: 18  Temp: 98.9 F (37.2 C)  TempSrc: Oral  SpO2: 99%    General appearance: AOx3 in no acute distress HEENT: NCAT.  Oropharynx clear.  Lungs: clear to auscultation bilaterally without adventitious breath sounds Heart: regular rate and rhythm.  Radial pulses 2+ symmetrical bilaterally Abdomen: soft, non-distended; normal active bowel sounds; tender to deep palpation about the RLQ and suprapubic region; TENDER at McBurney's point; negative Murphy's sign; negative rebound; guarding with RLQ  palpation Back: no CVA tenderness Extremities: no edema; symmetrical with no gross deformities Skin: warm and dry Neurologic: normal gait Psychological: alert and cooperative; normal mood and affect  ASSESSMENT & PLAN:  1. Right lower quadrant abdominal pain     Recommend further evaluation and treatment at the ED Cannot rule out appendicitis based on patients symptoms  Patient aware and in agreement with this plan Going by private vehicle in stable condition to ED   Reviewed expectations re: course of current medical issues. Questions answered. Outlined signs and symptoms indicating need for more acute intervention. Patient verbalized understanding. After Visit Summary given.   Rennis Harding, PA-C 04/03/18 1343

## 2018-04-03 NOTE — ED Provider Notes (Signed)
[facility] EMERGENCY DEPARTMENT Provider Note   CSN: [id number] Arrival date & time: 04/03/18  1354     History   Chief Complaint Chief Complaint  Patient presents with  . Abdominal Pain    HPI Ricky Mckenzie. is a 22 y.o. male.  HPI   22 year old male presents today with complaints of abdominal pain.  Patient notes acute onset yesterday with right lower quadrant pain, persistent sharp in nature and worse with movement or palpation..  No medications prior to arrival.  Patient has had a decreased appetite and nausea, no vomiting fever chills upper abdominal pain constipation diarrhea or urinary changes.  Patient has not had any food or drink since 8 AM noting he had coffee.  No history of prior surgeries.  Past Medical History:  Diagnosis Date  . Asthma 2000    Patient Active Problem List   Diagnosis Date Noted  . 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 Goiter 06/02/2013  . 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 Essential hypertension, benign 06/02/2013    History reviewed. No pertinent surgical history.      Home Medications    Prior to Admission medications   Medication Sig Start Date End Date Taking? Authorizing Provider  citalopram (CELEXA) 10 MG tablet Take 10 mg by mouth daily.    [provider]  fluticasone (FLONASE) 50 MCG/ACT nasal spray Place 1 spray into both nostrils daily. 03/07/18   Georgetta Haber, NP  loratadine (CLARITIN) 10 MG tablet Take 10 mg by mouth daily.    [provider]  montelukast (SINGULAIR) 10 MG tablet Take 10 mg by mouth at bedtime.    [provider]  naproxen (NAPROSYN) 500 MG tablet Take 1 tablet (500 mg total) by mouth 2 (two) times daily with a meal. 08/07/16   [name], PA-C    Family History Family History  Problem Relation Age of Onset  . Hypertension Father   . Diabetes Paternal Grandmother   . Kidney disease Paternal Grandmother   . Diabetes  Paternal Grandfather   . Kidney disease Paternal Grandfather   . Thyroid disease Brother     Social History Social History   Tobacco Use  . Smoking status: Passive Smoke Exposure - Never Smoker  Substance Use Topics  . Alcohol use: Yes  . Drug use: Never     Allergies   Patient has no known allergies.   Review of Systems Review of Systems  All other systems reviewed and are negative.    Physical Exam Updated Vital Signs BP (!) 141/99   Pulse 70   Temp 98.8 F (37.1 C) (Oral)   Resp 16   Ht 6' (1.829 m)   Wt 83.9 kg (185 lb)   SpO2 98%   BMI 25.09 kg/m   Physical Exam  Constitutional: He is oriented to person, place, and time. He appears well-developed and well-nourished.  HENT:  Head: Normocephalic and atraumatic.  Eyes: Pupils are equal, round, and reactive to light. Conjunctivae are normal. Right eye exhibits no discharge. Left eye exhibits no discharge. No scleral icterus.  Neck: Normal range of motion. No JVD present. No tracheal deviation present.  Pulmonary/Chest: Effort normal. No stridor.  Abdominal:  Tenderness palpation right lower quadrant remainder of abdomen soft nontender  Neurological: He is alert and oriented to person, place, and time. Coordination normal.  Psychiatric: He has a normal mood and affect. His behavior is normal. Judgment and thought content normal.  Nursing note and vitals reviewed.  ED Treatments / Results  Labs (all labs ordered are listed, but only abnormal results are displayed) Labs Reviewed  COMPREHENSIVE METABOLIC PANEL - Abnormal; Notable for the following components:      Result Value   Total Protein 8.5 (*)    Total Bilirubin 1.3 (*)    All other components within normal limits  URINALYSIS, ROUTINE W REFLEX MICROSCOPIC - Abnormal; Notable for the following components:   APPearance HAZY (*)    Ketones, ur 5 (*)    All other components within normal limits  CBC - Abnormal; Notable for the following components:    WBC 10.8 (*)    RDW 11.0 (*)    All other components within normal limits  LIPASE, BLOOD    EKG None  Radiology Ct Abdomen Pelvis W Contrast  Result Date: 04/03/2018 CLINICAL DATA:  Abdominal pain question appendicitis, abdominal pain since yesterday afternoon with nausea, no vomiting, tender all over more significantly on RIGHT EXAM: CT ABDOMEN AND PELVIS WITH CONTRAST TECHNIQUE: Multidetector CT imaging of the abdomen and pelvis was performed using the standard protocol following bolus administration of intravenous contrast. Sagittal and coronal MPR images reconstructed from axial data set. CONTRAST:  100mL OMNIPAQUE IOHEXOL 300 MG/ML SOLN IV. No oral contrast. COMPARISON:  None FINDINGS: Lower chest: Lung bases clear Hepatobiliary: Gallbladder and liver normal appearance Pancreas: Normal appearance Spleen: Normal appearance Adrenals/Urinary Tract: Adrenal glands, kidneys, ureters, and decompressed bladder normal appearance Stomach/Bowel: Acute appendicitis: Appendix: Location: Inferior to cecal tip into RIGHT pelvis Diameter: 12 mm Appendicolith: Absent Mucosal hyper-enhancement: Mild Extraluminal gas: None Periappendiceal collection: Adjacent edema. Small fluid collection medial to the appendix is identified, approximately 20 x 15 x 14 mm, uncertain if represents a focal periappendiceal collection, periappendiceal fluid between bowel loops, or fluid within a small bowel loop. Vascular/Lymphatic: Vascular structures unremarkable. No adenopathy. Few normal sized mesenteric lymph nodes in RIGHT mid abdomen. Reproductive: Unremarkable prostate gland and seminal vesicles Other: Small amount of free fluid in pelvis. No free air. No hernia. Musculoskeletal: Unremarkable IMPRESSION: Acute appendicitis with periappendiceal infiltration and fluid as well as free fluid in the pelvis. A 20 x 15 x 14 mm diameter focal fluid collection is seen medial to the appendix without significant wall thickening or  surrounding inflammation, may represent a small periappendiceal collection, periappendiceal free fluid between bowel loops, or be related to fluid in a bowel loop. Electronically Signed   By: Ulyses SouthwardMark  Boles M.D.   On: 04/03/2018 16:13    Procedures Procedures (including critical care time)  Medications Ordered in ED Medications  cefTRIAXone (ROCEPHIN) 2 g in sodium chloride 0.9 % 100 mL IVPB (2 g Intravenous New Bag/Given 04/03/18 1717)    And  metroNIDAZOLE (FLAGYL) IVPB 500 mg (500 mg Intravenous New Bag/Given 04/03/18 1719)  iohexol (OMNIPAQUE) 300 MG/ML solution 100 mL (100 mLs Intravenous Contrast Given 04/03/18 1536)     Initial Impression / Assessment and Plan / ED Course  I have reviewed the triage vital signs and the nursing notes.  Pertinent labs & imaging results that were available during my care of the patient were reviewed by me and considered in my medical decision making (see chart for details).     Labs: Urinalysis, CBC, CMP, lipase  Imaging: ED abdomen pelvis with contrast  Consults: General surgery   Therapeutics:  Discharge Meds:   Assessment/Plan: 22 year old male presents today with acute appendicitis.  This does not appear to have complicating features, he is afebrile with minimal elevation in white count.  Patient is n.p.o., general surgery consulted who evaluated patient at bedside.     Final Clinical Impressions(s) / ED Diagnoses   Final diagnoses:  Acute appendicitis, unspecified acute appendicitis type    ED Discharge Orders    None       Rosalio Loud 04/03/18 1722    Gerhard Munch, MD 04/04/18 2132

## 2018-04-03 NOTE — Transfer of Care (Signed)
Immediate Anesthesia Transfer of Care Note  Patient: Ricky Kaufmannony B Waldron Jr.  Procedure(s) Performed: APPENDECTOMY LAPAROSCOPIC (N/A Abdomen)  Patient Location: PACU  Anesthesia Type:General  Level of Consciousness: awake  Airway & Oxygen Therapy: Patient Spontanous Breathing  Post-op Assessment: Report given to RN and Post -op Vital signs reviewed and stable  Post vital signs: Reviewed and stable  Last Vitals:  Vitals Value Taken Time  BP    Temp    Pulse    Resp    SpO2      Last Pain:  Vitals:   04/03/18 1433  TempSrc:   PainSc: 5          Complications: No apparent anesthesia complications

## 2018-04-03 NOTE — H&P (Signed)
Surgical H&P  CC: abdominal pain  HPI: otherwise healthy 22 year old gentleman [facility] for the last 24 hours. It started as vague right-sided abdominal pain associated with significant nausea yesterday afternoon. The pain is more diffuse now. He denies any known fevers or diarrhea. Last prominent was yesterday morning and was normal. No vomiting. Associated anorexia, last by mouth intake was yesterday evening.  No prior similar symptoms.  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   No Known Allergies  Past Medical History:  Diagnosis Date  . Asthma 2000    History reviewed. No pertinent surgical history.  Family History  Problem Relation Age of Onset  . Hypertension Father   . Diabetes Paternal Grandmother   . Kidney disease Paternal Grandmother   . Diabetes Paternal Grandfather   . Kidney disease Paternal Grandfather   . Thyroid disease Brother     Social History   Socioeconomic History  . Marital status: Single    Spouse name: Not on file  . Number of children: Not on file  . Years of education: Not on file  . Highest education level: Not on file  Occupational History  . Not on file  Social Needs  . Financial resource strain: Not on file  . Food insecurity:    Worry: Not on file    Inability: Not on file  . Transportation needs:    Medical: Not on file    Non-medical: Not on file  Tobacco Use  . Smoking status: Passive Smoke Exposure - Never Smoker  Substance and Sexual Activity  . Alcohol use: Yes  . Drug use: Never  . Sexual activity: Not on file  Lifestyle  . Physical activity:    Days per week: Not on file    Minutes per session: Not on file  . Stress: Not on file  Relationships  . Social connections:    Talks on phone: Not on file    Gets together: Not on file    Attends religious service: Not on file    Active member of club or organization: Not on file    Attends meetings of clubs or  organizations: Not on file    Relationship status: Not on file  Other Topics Concern  . Not on file  Social History Narrative   Lives with dad and two sibblings attends Early College at [employer] will start 11th grade.    No current facility-administered medications on file prior to encounter.    Current Outpatient Medications on File Prior to Encounter  Medication Sig Dispense Refill  . citalopram (CELEXA) 10 MG tablet Take 10 mg by mouth daily.    . fluticasone (FLONASE) 50 MCG/ACT nasal spray Place 1 spray into both nostrils daily. 16 g 2  . loratadine (CLARITIN) 10 MG tablet Take 10 mg by mouth daily.    . montelukast (SINGULAIR) 10 MG tablet Take 10 mg by mouth at bedtime.    . naproxen (NAPROSYN) 500 MG tablet Take 1 tablet (500 mg total) by mouth 2 (two) times daily with a meal. 30 tablet 0    Review of Systems: a complete, 10pt review of systems was completed with pertinent positives and negatives as documented in the HPI  Physical Exam: Vitals:   04/03/18 1403 04/03/18 1720  BP: 137/89 (!) 141/99  Pulse: 86 70  Resp: 16 16  Temp: 98.8 F (37.1 C)   SpO2: 98% 98%   Gen:  A&Ox3, no distress  Head: normocephalic, atraumatic Eyes: extraocular motions intact, anicteric.  Neck: supple without mass or thyromegaly Chest: unlabored respirations, symmetrical air entry, clear bilaterally   Cardiovascular: RRR with palpable distal pulses, no pedal edema Abdomen: soft, nondistended, tender in lower abdomen, right worse than left, positive Rovsing. No peritoneal signs. No mass or organomegaly.  Extremities: warm, without edema, no deformities  Neuro: grossly intact Psych: appropriate mood and affect, normal insight  Skin: warm and dry   CBC Latest Ref Rng & Units 04/03/2018 08/07/2016 08/07/2016  WBC 4.0 - 10.5 K/uL 10.8(H) 13.4(H) -  Hemoglobin 13.0 - 17.0 g/dL 16.1 09.6 04.5  Hematocrit 39.0 - 52.0 % 43.5 40.0 43.0  Platelets 150 - 400 K/uL 222 171 -    CMP Latest Ref Rng &  Units 04/03/2018 08/07/2016 08/07/2016  Glucose 65 - 99 mg/dL 87 409(W) 119(J)  BUN 6 - 20 mg/dL 13 9 13   Creatinine 0.61 - 1.24 mg/dL 4.78 2.95 6.21  Sodium 135 - 145 mmol/L 138 138 140  Potassium 3.5 - 5.1 mmol/L 4.1 4.8 4.4  Chloride 101 - 111 mmol/L 101 106 104  CO2 22 - 32 mmol/L 26 23 -  Calcium 8.9 - 10.3 mg/dL 9.5 9.1 -  Total Protein 6.5 - 8.1 g/dL 3.0(Q) - -  Total Bilirubin 0.3 - 1.2 mg/dL 6.5(H) - -  Alkaline Phos 38 - 126 U/L 59 - -  AST 15 - 41 U/L 27 - -  ALT 17 - 63 U/L 37 - -    No results found for: INR, PROTIME  Imaging: Ct Abdomen Pelvis W Contrast  Result Date: 04/03/2018 CLINICAL DATA:  Abdominal pain question appendicitis, abdominal pain since yesterday afternoon with nausea, no vomiting, tender all over more significantly on RIGHT EXAM: CT ABDOMEN AND PELVIS WITH CONTRAST TECHNIQUE: Multidetector CT imaging of the abdomen and pelvis was performed using the standard protocol following bolus administration of intravenous contrast. Sagittal and coronal MPR images reconstructed from axial data set. CONTRAST:  OMNIPAQUE IOHEXOL 300 MG/ML SOLN IV. No oral contrast. COMPARISON:  None FINDINGS: Lower chest: Lung bases clear Hepatobiliary: Gallbladder and liver normal appearance Pancreas: Normal appearance Spleen: Normal appearance Adrenals/Urinary Tract: Adrenal glands, kidneys, ureters, and decompressed bladder normal appearance Stomach/Bowel: Acute appendicitis: Appendix: Location: Inferior to cecal tip into RIGHT pelvis Diameter: 12 mm Appendicolith: Absent Mucosal hyper-enhancement: Mild Extraluminal gas: None Periappendiceal collection: Adjacent edema. Small fluid collection medial to the appendix is identified, approximately 20 x 15 x 14 mm, uncertain if represents a focal periappendiceal collection, periappendiceal fluid between bowel loops, or fluid within a small bowel loop. Vascular/Lymphatic: Vascular structures unremarkable. No adenopathy. Few normal sized  mesenteric lymph nodes in RIGHT mid abdomen. Reproductive: Unremarkable prostate gland and seminal vesicles Other: Small amount of free fluid in pelvis. No free air. No hernia. Musculoskeletal: Unremarkable IMPRESSION: Acute appendicitis with periappendiceal infiltration and fluid as well as free fluid in the pelvis. A 20 x 15 x 14 mm diameter focal fluid collection is seen medial to the appendix without significant wall thickening or surrounding inflammation, may represent a small periappendiceal collection, periappendiceal free fluid between bowel loops, or be related to fluid in a bowel loop. Electronically Signed   By: Ulyses Southward M.D.   On: 04/03/2018 16:13      A/P: healthy 22 year old man with acute appendicitis, possible early perforation. I recommended proceeding with laparoscopic appendectomy this evening. I discussed with him the technique of the surgery and the risks of bleeding, infection,  pain, scarring, intra-abdominal injury, staple line leak or abscess, conversion to open surgery, postoperative ileus. Discuss possibility of leaving a drain depending on intraoperative findings. Questions were welcomed and answered. We'll proceed to the OR as soon as room is available.   Phylliss Blakes, MD [facility], [place] Pager [phone]

## 2018-04-04 ENCOUNTER — Encounter (HOSPITAL_COMMUNITY): Payer: Self-pay | Admitting: Surgery

## 2018-04-04 ENCOUNTER — Other Ambulatory Visit: Payer: Self-pay

## 2018-04-04 LAB — BASIC METABOLIC PANEL
Anion gap: 5 (ref 5–15)
BUN: 10 mg/dL (ref 6–20)
CO2: 29 mmol/L (ref 22–32)
CREATININE: 1.13 mg/dL (ref 0.61–1.24)
Calcium: 8.7 mg/dL — ABNORMAL LOW (ref 8.9–10.3)
Chloride: 104 mmol/L (ref 101–111)
GFR calc Af Amer: >60 mL/min (ref >60)
Glucose, Bld: 113 mg/dL — ABNORMAL HIGH (ref 65–99)
Potassium: 4.4 mmol/L (ref 3.5–5.1)
SODIUM: 138 mmol/L (ref 135–145)

## 2018-04-04 LAB — CBC
HCT: 38.4 % — ABNORMAL LOW (ref 39.0–52.0)
Hemoglobin: 13.1 g/dL (ref 13.0–17.0)
MCH: 31.2 pg (ref 26.0–34.0)
MCHC: 34.1 g/dL (ref 30.0–36.0)
MCV: 91.4 fL (ref 78.0–100.0)
PLATELETS: 194 10*3/uL (ref 150–400)
RBC: 4.2 MIL/uL — ABNORMAL LOW (ref 4.22–5.81)
RDW: 11.3 % — AB (ref 11.5–15.5)
WBC: 10 10*3/uL (ref 4.0–10.5)

## 2018-04-04 LAB — HIV ANTIBODY (ROUTINE TESTING W REFLEX): HIV Screen 4th Generation wRfx: NONREACTIVE

## 2018-04-04 MED ORDER — METHOCARBAMOL 500 MG PO TABS: 500.0000 mg | ORAL_TABLET | Freq: Four times a day (QID) | ORAL | 0 refills | Status: AC | PRN

## 2018-04-04 MED ORDER — TRAMADOL HCL 50 MG PO TABS: 50.0000 mg | ORAL_TABLET | Freq: Four times a day (QID) | ORAL | 0 refills | Status: AC | PRN

## 2018-04-04 MED ORDER — ACETAMINOPHEN 500 MG PO TABS: 1000.0000 mg | ORAL_TABLET | Freq: Four times a day (QID) | ORAL | 0 refills | Status: AC | PRN

## 2018-04-04 MED ORDER — HYDROMORPHONE HCL 2 MG/ML IJ SOLN: 0.5000 mg | INTRAMUSCULAR | Status: DC | PRN

## 2018-04-04 NOTE — Progress Notes (Signed)
S: No acute events. Preop nausea and pain resolved. Incisional pain as expected. Ambulating and voiding without difficulty  Vitals, labs, intake/output, and orders reviewed at this time.  Gen: A&Ox3, no distress  H&N: EOMI, atraumatic, neck supple Chest: unlabored respirations, RRR Abd: soft, appropriately tender, nondistended, a little subcu air/ crepitus present from insufflation, incisions c/d/i with dermabond. Ext: warm, no edema Neuro: grossly normal  Lines/tubes/drains: PIV  A/P:  POD 1 laparoscopic appendectomy Plan discharge later today   Phylliss Blakeshelsea Neviah Braud, MD [facility], [place] Pager [phone]

## 2018-04-04 NOTE — Anesthesia Postprocedure Evaluation (Signed)
Anesthesia Post Note  Patient: Bluford Kaufmannony B Pla Jr.  Procedure(s) Performed: APPENDECTOMY LAPAROSCOPIC (N/A Abdomen)     Patient location during evaluation: PACU Anesthesia Type: General Level of consciousness: awake and alert Pain management: pain level controlled Vital Signs Assessment: post-procedure vital signs reviewed and stable Respiratory status: spontaneous breathing, nonlabored ventilation, respiratory function stable and patient connected to nasal cannula oxygen Cardiovascular status: blood pressure returned to baseline and stable Postop Assessment: no apparent nausea or vomiting Anesthetic complications: no    Last Vitals:  Vitals:   04/03/18 2210 04/03/18 2227  BP: 134/87 133/86  Pulse: 71 74  Resp: 18 16  Temp:  36.8 C  SpO2: 97% 98%    Last Pain:  Vitals:   04/03/18 2343  TempSrc:   PainSc: 7                  Kennieth RadFitzgerald, Nanako Stopher E

## 2018-04-04 NOTE — Progress Notes (Signed)
Patients dad arrived to transport pt home. Patient ready for discharge.

## 2018-04-04 NOTE — Progress Notes (Signed)
Discharge paperwork reviewed with patient. No questions verbalized. Patient is awaiting ride and will then be ready for discharge.

## 2018-04-04 NOTE — Discharge Summary (Signed)
[facility] Discharge Summary   Patient ID: Ricky Mckenzie. MRN: [id number] DOB/AGE: [birth year] 21 y.o.  Admit date: 04/03/2018 Discharge date: 04/04/2018  Admitting Diagnosis: Acute appendicitis  Discharge Diagnosis Acute appendicitis - s/p laparoscopic appendectomy  Consultants None  Imaging: Ct Abdomen Pelvis W Contrast  Result Date: 04/03/2018 CLINICAL DATA:  Abdominal pain question appendicitis, abdominal pain since yesterday afternoon with nausea, no vomiting, tender all over more significantly on RIGHT EXAM: CT ABDOMEN AND PELVIS WITH CONTRAST TECHNIQUE: Multidetector CT imaging of the abdomen and pelvis was performed using the standard protocol following bolus administration of intravenous contrast. Sagittal and coronal MPR images reconstructed from axial data set. CONTRAST:  OMNIPAQUE IOHEXOL 300 MG/ML SOLN IV. No oral contrast. COMPARISON:  None FINDINGS: Lower chest: Lung bases clear Hepatobiliary: Gallbladder and liver normal appearance Pancreas: Normal appearance Spleen: Normal appearance Adrenals/Urinary Tract: Adrenal glands, kidneys, ureters, and decompressed bladder normal appearance Stomach/Bowel: Acute appendicitis: Appendix: Location: Inferior to cecal tip into RIGHT pelvis Diameter: 12 mm Appendicolith: Absent Mucosal hyper-enhancement: Mild Extraluminal gas: None Periappendiceal collection: Adjacent edema. Small fluid collection medial to the appendix is identified, approximately 20 x 15 x 14 mm, uncertain if represents a focal periappendiceal collection, periappendiceal fluid between bowel loops, or fluid within a small bowel loop. Vascular/Lymphatic: Vascular structures unremarkable. No adenopathy. Few normal sized mesenteric lymph nodes in RIGHT mid abdomen. Reproductive: Unremarkable prostate gland and seminal vesicles Other: Small amount of free fluid in pelvis. No free air. No hernia. Musculoskeletal: Unremarkable IMPRESSION: Acute appendicitis  with periappendiceal infiltration and fluid as well as free fluid in the pelvis. A 20 x 15 x 14 mm diameter focal fluid collection is seen medial to the appendix without significant wall thickening or surrounding inflammation, may represent a small periappendiceal collection, periappendiceal free fluid between bowel loops, or be related to fluid in a bowel loop. Electronically Signed   By: Ulyses Southward M.D.   On: 04/03/2018 16:13    Procedures Dr. Fredricka Bonine (04/03/18) - Laparoscopic Appendectomy  Hospital Course:  Patient is a 22 year old male who presented to [facility] with abdominal pain.  Workup showed acute appendicitis.  Patient was admitted and underwent procedure listed above.  Tolerated procedure well and was transferred to the floor.  Diet was advanced as tolerated.  On POD#1, the patient was voiding well, tolerating diet, ambulating well, pain well controlled, vital signs stable, incisions c/d/i and felt stable for discharge home.  Patient will follow up in our office in 2 weeks and knows to call with questions or concerns.  He will call to confirm appointment date/time.    Physical Exam: General:  Alert, NAD, pleasant, comfortable Abd:  Soft, ND, mild tenderness, incisions C/D/I  Allergies as of 04/04/2018   No Known Allergies     Medication List    STOP taking these medications   naproxen 500 MG tablet Commonly known as:  NAPROSYN     TAKE these medications   acetaminophen 500 MG tablet Commonly known as:  TYLENOL Take 2 tablets (1,000 mg total) by mouth every 6 (six) hours as needed for mild pain.   citalopram 10 MG tablet Commonly known as:  CELEXA Take 10 mg by mouth daily.   fluticasone 50 MCG/ACT nasal spray Commonly known as:  FLONASE Place 1 spray into both nostrils daily.   loratadine 10 MG tablet Commonly known as:  CLARITIN Take 10 mg by mouth daily.   methocarbamol 500 MG tablet Commonly known as:  ROBAXIN Take 1  tablet (500 mg total) by mouth every 6 (six)  hours as needed for muscle spasms.   montelukast 10 MG tablet Commonly known as:  SINGULAIR Take 10 mg by mouth at bedtime.   traMADol 50 MG tablet Commonly known as:  ULTRAM Take 1 tablet (50 mg total) by mouth every 6 (six) hours as needed (mild pain).        Follow-up Information    [facility].   Specialty:  General Surgery Contact information: [address] [place] [address] [phone]           Signed: Wells GuilesKelly Rayburn, [facility] 04/04/2018, 8:11 AM Pager: [phone] Consults: [phone] Mon-Fri 7:00 am-4:30 pm Sat-Sun 7:00 am-11:30 am

## 2018-04-04 NOTE — Discharge Instructions (Signed)
Please arrive at least 30 min before your appointment to complete your check in paperwork.  If you are unable to arrive 30 min prior to your appointment time we may have to cancel or reschedule you. ° °LAPAROSCOPIC SURGERY: POST OP INSTRUCTIONS  °1. DIET: Follow a light bland diet the first 24 hours after arrival home, such as soup, liquids, crackers, etc. Be sure to include lots of fluids daily. Avoid fast food or heavy meals as your are more likely to get nauseated. Eat a low fat the next few days after surgery.  °2. Take your usually prescribed home medications unless otherwise directed. °3. PAIN CONTROL:  °1. Pain is best controlled by a usual combination of three different methods TOGETHER:  °1. Ice/Heat °2. Over the counter pain medication °3. Prescription pain medication °2. Most patients will experience some swelling and bruising around the incisions. Ice packs or heating pads (30-60 minutes up to 6 times a day) will help. Use ice for the first few days to help decrease swelling and bruising, then switch to heat to help relax tight/sore spots and speed recovery. Some people prefer to use ice alone, heat alone, alternating between ice & heat. Experiment to what works for you. Swelling and bruising can take several weeks to resolve.  °3. It is helpful to take an over-the-counter pain medication regularly for the first few weeks. Choose one of the following that works best for you:  °1. Naproxen (Aleve, etc) Two 220mg tabs twice a day °2. Ibuprofen (Advil, etc) Three 200mg tabs four times a day (every meal & bedtime) °3. Acetaminophen (Tylenol, etc) 500-650mg four times a day (every meal & bedtime) °4. A prescription for pain medication (such as oxycodone, hydrocodone, etc) should be given to you upon discharge. Take your pain medication as prescribed.  °1. If you are having problems/concerns with the prescription medicine (does not control pain, nausea, vomiting, rash, itching, etc), please call us [phone]  [phone] to see if we need to switch you to a different pain medicine that will work better for you and/or control your side effect better. °2. If you need a refill on your pain medication, please contact your pharmacy. They will contact our office to request authorization. Prescriptions will not be filled after 5 pm or on week-ends. °4. Avoid getting constipated. Between the surgery and the pain medications, it is common to experience some constipation. Increasing fluid intake and taking a fiber supplement (such as Metamucil, Citrucel, FiberCon, MiraLax, etc) 1-2 times a day regularly will usually help prevent this problem from occurring. A mild laxative (prune juice, Milk of Magnesia, MiraLax, etc) should be taken according to package directions if there are no bowel movements after 48 hours.  °5. Watch out for diarrhea. If you have many loose bowel movements, simplify your diet to bland foods & liquids for a few days. Stop any stool softeners and decrease your fiber supplement. Switching to mild anti-diarrheal medications (Kayopectate, Pepto Bismol) can help. If this worsens or does not improve, please call us. °6. Wash / shower every day. You may shower over the dressings as they are waterproof. Continue to shower over incision(s) after the dressing is off. °7. Remove your waterproof bandages 5 days after surgery. You may leave the incision open to air. You may replace a dressing/Band-Aid to cover the incision for comfort if you wish.  °8. ACTIVITIES as tolerated:  °1. You may resume regular (light) daily activities beginning the next day--such as daily self-care, walking, climbing stairs--gradually   increasing activities as tolerated. If you can walk 30 minutes without difficulty, it is safe to try more intense activity such as jogging, treadmill, bicycling, low-impact aerobics, swimming, etc. °2. Save the most intensive and strenuous activity for last such as sit-ups, heavy lifting, contact sports, etc Refrain  from any heavy lifting or straining until you are off narcotics for pain control.  °3. DO NOT PUSH THROUGH PAIN. Let pain be your guide: If it hurts to do something, don't do it. Pain is your body warning you to avoid that activity for another week until the pain goes down. °4. You may drive when you are no longer taking prescription pain medication, you can comfortably wear a seatbelt, and you can safely maneuver your car and apply brakes. °5. You may have sexual intercourse when it is comfortable.  °9. FOLLOW UP in our office  °1. Please call [facility] at [phone] to set up an appointment to see your surgeon in the office for a follow-up appointment approximately 2-3 weeks after your surgery. °2. Make sure that you call for this appointment the day you arrive home to insure a convenient appointment time. °     10. IF YOU HAVE DISABILITY OR FAMILY LEAVE FORMS, BRING THEM TO THE               OFFICE FOR PROCESSING.  ° °WHEN TO CALL US [phone]:  °1. Poor pain control °2. Reactions / problems with new medications (rash/itching, nausea, etc)  °3. Fever over 101.5 F (38.5 C) °4. Inability to urinate °5. Nausea and/or vomiting °6. Worsening swelling or bruising °7. Continued bleeding from incision. °8. Increased pain, redness, or drainage from the incision ° °The clinic staff is available to answer your questions during regular business hours (8:30am-5pm). Please don’t hesitate to call and ask to speak to one of our nurses for clinical concerns.  °If you have a medical emergency, go to the nearest emergency room or call 911.  °A surgeon from [facility] is always on call at the hospitals  ° °[facility]  °[address], [address], [place], [place] [address] ?  °MAIN: [phone] ? TOLL FREE: [phone] ?  °FAX [phone]  °[email or web address] ° °

## 2019-08-21 ENCOUNTER — Emergency Department (HOSPITAL_COMMUNITY)
Admission: EM | Admit: 2019-08-21 | Discharge: 2019-08-21 | Disposition: A | Discharge status: Home / Self Care | Payer: BC Managed Care – PPO | Attending: Emergency Medicine | Admitting: Emergency Medicine

## 2019-08-21 ENCOUNTER — Other Ambulatory Visit: Payer: Self-pay

## 2019-08-21 ENCOUNTER — Encounter (HOSPITAL_COMMUNITY): Payer: Self-pay

## 2019-08-21 DIAGNOSIS — Z7722 Contact with and (suspected) exposure to environmental tobacco smoke (acute) (chronic): Secondary | ICD-10-CM | POA: Diagnosis not present

## 2019-08-21 DIAGNOSIS — E038 Other specified hypothyroidism: Secondary | ICD-10-CM | POA: Diagnosis not present

## 2019-08-21 DIAGNOSIS — Z79899 Other long term (current) drug therapy: Secondary | ICD-10-CM | POA: Insufficient documentation

## 2019-08-21 DIAGNOSIS — J45909 Unspecified asthma, uncomplicated: Secondary | ICD-10-CM | POA: Diagnosis not present

## 2019-08-21 DIAGNOSIS — I10 Essential (primary) hypertension: Secondary | ICD-10-CM | POA: Diagnosis not present

## 2019-08-21 DIAGNOSIS — F419 Anxiety disorder, unspecified: Secondary | ICD-10-CM | POA: Diagnosis not present

## 2019-08-21 MED ORDER — HYDROXYZINE HCL 25 MG PO TABS: 25.0000 mg | ORAL_TABLET | Freq: Three times a day (TID) | ORAL | 0 refills | Status: AC | PRN

## 2019-08-21 MED ORDER — LORAZEPAM 1 MG PO TABS
1.0000 mg | ORAL_TABLET | Freq: Once | ORAL | Status: AC
  Administered 2019-08-21: 1 mg via ORAL
  Filled 2019-08-21: qty 1

## 2019-08-21 NOTE — ED Provider Notes (Signed)
[facility]-EMERGENCY DEPT Provider Note   CSN: [id number] Arrival date & time: 08/21/19  1617     History   Chief Complaint Chief Complaint  Patient presents with   Anxiety   Panic Attack    HPI Ricky Mckenzie. is a 23 y.o. male presenting for evaluation of anxiety.  Patient states he has had anxiety his whole life.  This manifests as panic attacks without significant triggers.  He can feel anxious just by the phone ringing.  He was started on citalopram 4 to 5 weeks ago by his primary care doctor, but feels this is not helping.  He is also in talk therapy weekly.  He has a follow-up appointment with his doctor next week.  Over the past 24 hours, he has reported increased anxiety.  Patient states he was unable to sleep last night due to feeling anxious.  He denies specific trigger over the past 24 hours, but states that in general he feels like he is more stressed because of his work.  He states that when he got very overwhelmed earlier today he had a passive suicidal thought, although this resolved.  He denies SI at this time.  He never had a plan.  He denies HI or AVH.  He denies previous history of self-harm.  He reports no other medical problems, takes medications daily.  Patient states that when he has not anxiety attack it feels as if his heart is racing, he feels short of breath, and nauseous.  He does not feel these symptoms with exertion.      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 Authorizing Provider  acetaminophen (TYLENOL) 500 MG tablet Take 2 tablets (1,000 mg total) by mouth every 6 (six) hours as needed for mild pain. 04/04/18   Rayburn, Alphonsus Sias, PA-C  citalopram (CELEXA) 10 MG tablet Take 10 mg by mouth daily.    [provider]  fluticasone (FLONASE) 50 MCG/ACT nasal spray Place 1 spray into both nostrils daily. 03/07/18   Georgetta Haber, NP  hydrOXYzine (ATARAX/VISTARIL) 25 MG tablet Take 1 tablet (25 mg total) by mouth every 8 (eight) hours as needed. 08/21/19   Dajour Pierpoint, PA-C  loratadine (CLARITIN) 10 MG tablet Take 10 mg by mouth daily.    [provider]  methocarbamol (ROBAXIN) 500 MG tablet Take 1 tablet (500 mg total) by mouth every 6 (six) hours as needed for muscle spasms. 04/04/18   Rayburn, Tresa Endo A, PA-C  montelukast (SINGULAIR) 10 MG tablet Take 10 mg by mouth at bedtime.    [provider]  traMADol (ULTRAM) 50 MG tablet Take 1 tablet (50 mg total) by mouth every 6 (six) hours as needed (mild pain). 04/04/18   Rayburn, Alphonsus Sias, PA-C    Family History Family History  Problem Relation Age of Onset   Hypertension  Father    Diabetes Paternal Grandmother    Kidney disease Paternal Grandmother    Diabetes Paternal Grandfather    Kidney disease Paternal Grandfather    Thyroid disease Brother     Social History Social History   Tobacco Use   Smoking status: Passive Smoke Exposure - Never Smoker   Smokeless tobacco: Never Used  Substance Use Topics   Alcohol use: Yes   Drug use: Never     Allergies   Other   Review of Systems Review of Systems  Psychiatric/Behavioral: Positive for sleep disturbance. The patient is nervous/anxious.   All other systems reviewed and are negative.    Physical Exam Updated Vital Signs BP (!) 148/107 (BP  Location: Left Arm)    Pulse (!) 101    Temp 98.4 F (36.9 C) (Oral)    Resp 18    Ht 6' (1.829 m)    Wt 97.5 kg    SpO2 99%    BMI 29.16 kg/m   Physical Exam Vitals signs and nursing note reviewed.  Constitutional:      General: He is not in acute distress.    Appearance: He is well-developed.     Comments: Appears anxious, but otherwise nontoxic  HENT:     Head: Normocephalic and atraumatic.  Eyes:     Conjunctiva/sclera: Conjunctivae normal.     Pupils: Pupils are equal, round, and reactive to light.  Neck:     Musculoskeletal: Normal range of motion and neck supple.  Cardiovascular:     Rate and Rhythm: Regular rhythm. Tachycardia present.     Pulses: Normal pulses.     Comments: Mildly tachycardic around 105 Pulmonary:     Effort: Pulmonary effort is normal. No respiratory distress.     Breath sounds: Normal breath sounds. No wheezing.     Comments: Speaking in full sentences.  Clear lung sounds in all fields. Abdominal:     General: There is no distension.     Palpations: Abdomen is soft. There is no mass.     Tenderness: There is no abdominal tenderness. There is no guarding or rebound.  Musculoskeletal: Normal range of motion.  Skin:    General: Skin is warm and dry.     Capillary Refill: Capillary refill takes less than 2 seconds.  Neurological:     Mental Status: He is alert and oriented to person, place, and time.  Psychiatric:        Attention and Perception: He does not perceive auditory hallucinations.        Mood and Affect: Mood is anxious.        Thought Content: Thought content does not include homicidal or suicidal ideation. Thought content does not include homicidal or suicidal plan.     Comments: Speaking rapidly.  No tangential thoughts.  Patient appears anxious.  Passive suicidal thought earlier today, since resolved.  No SI, HI, or AVH at this time      ED Treatments / Results  Labs (all labs ordered are listed, but only abnormal results are  displayed) Labs Reviewed - No data to display  EKG None  Radiology No results found.  Procedures Procedures (including critical care time)  Medications Ordered in ED Medications  LORazepam (ATIVAN) tablet 1 mg (1 mg Oral Given 08/21/19 2030)     Initial Impression / Assessment and Plan / ED Course  I have reviewed the triage vital signs and the nursing notes.  Pertinent labs & imaging results that were available during  my care of the patient were reviewed by me and considered in my medical decision making (see chart for details).        Patient resenting for evaluation of anxiety.  Physical examination, he appears nontoxic.  No worsening of symptoms with exertion, doubt underlying medical cause such as PE.  Patient denying current SI, HI, or AVH, I do not believe he is danger to self or others at this time, do not believe he needs emergent behavioral health evaluation and management.  Will give Ativan for anxiety and reassess.  Patient states he has an appointment with his therapist at 83 tonight, approximately 20 minutes.  Heart rate improved with Ativan.  Will discharge patient to follow-up with his therapist.  Discussed strict return precautions for any SI or worsening of symptoms.  Resources given including crisis lines.  Hydroxyzine given as needed for anxiety.  At this time, patient appears safe for discharge.  Return precautions given.  Patient states he understands and agrees to plan.  Final Clinical Impressions(s) / ED Diagnoses   Final diagnoses:  Anxiety    ED Discharge Orders         Ordered    hydrOXYzine (ATARAX/VISTARIL) 25 MG tablet  Every 8 hours PRN     08/21/19 2013           Franchot Heidelberg, PA-C 08/21/19 2237    Lucrezia Starch, MD 08/23/19 [phone]

## 2019-08-21 NOTE — ED Triage Notes (Signed)
Pt presents with c/o anxiety. Pt reports he has been having panic attacks all day. Pt reports he has been very stressed out at work. Hx of same.

## 2019-08-21 NOTE — Discharge Instructions (Addendum)
Go directly to your appointment with your therapist. Use the hydroxyzine as needed for anxiety unless her therapist directly otherwise. There is information about psychiatric resources in the community in the paperwork. Return to the emergency room if you have any thoughts about wanting to hurt yourself or anyone else.

## 2019-09-23 IMAGING — CT CT ABD-PELV W/ CM
2 of 4 series · 16 of 46 positions shown, 18 images · IV contrast (APPLIED)
Comparison: None

CLINICAL DATA: Abdominal pain question appendicitis, abdominal pain
since yesterday afternoon with nausea, no vomiting, tender all over
more significantly on RIGHT

EXAM:
CT ABDOMEN AND PELVIS WITH CONTRAST
TECHNIQUE: Multidetector CT imaging of the abdomen and pelvis was performed
using the standard protocol following bolus administration of
intravenous contrast. Sagittal and coronal MPR images reconstructed
from axial data set.
CONTRAST:  100mL OMNIPAQUE IOHEXOL 300 MG/ML SOLN IV. No oral
contrast.

[Series 3: abd/ pelvis 5.0 i30f 2 · axial · 0.88mm/px · z∈[+830,+1285]mm · 13 of 101 slices shown, 15 images]
[im 5/101  soft-tissue]
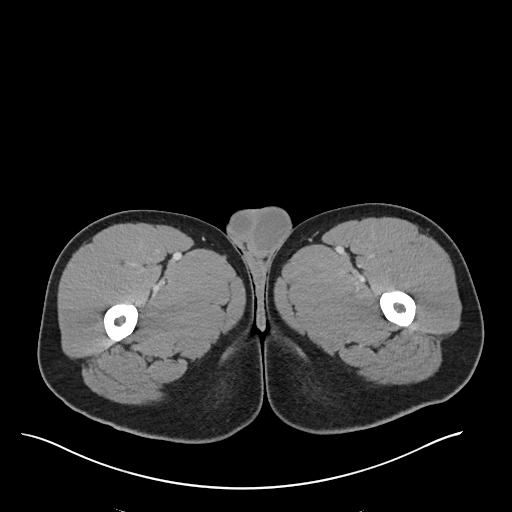
[im 5/101  bone]
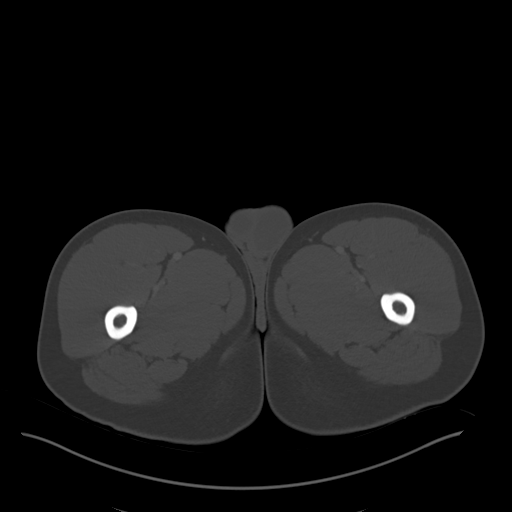
[im 13/101  soft-tissue]
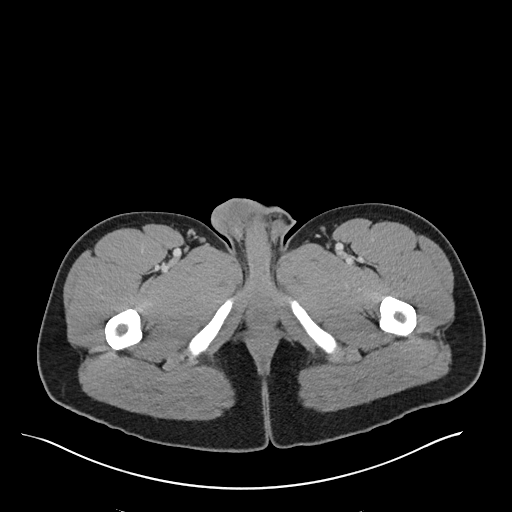
[im 21/101  soft-tissue]
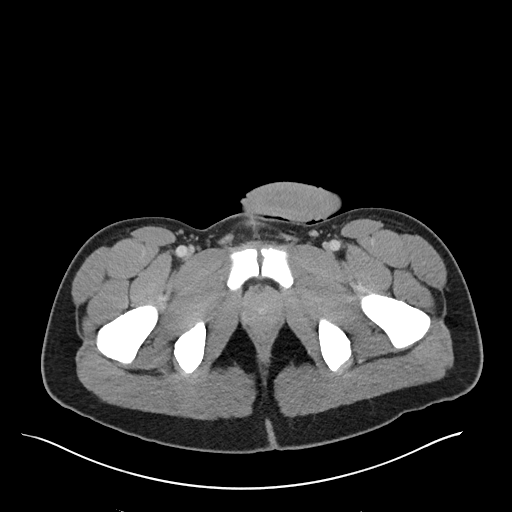
[im 30/101  soft-tissue]
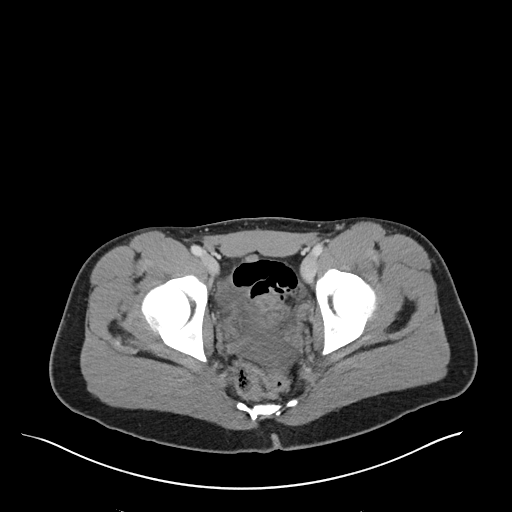
[im 34/101  soft-tissue]
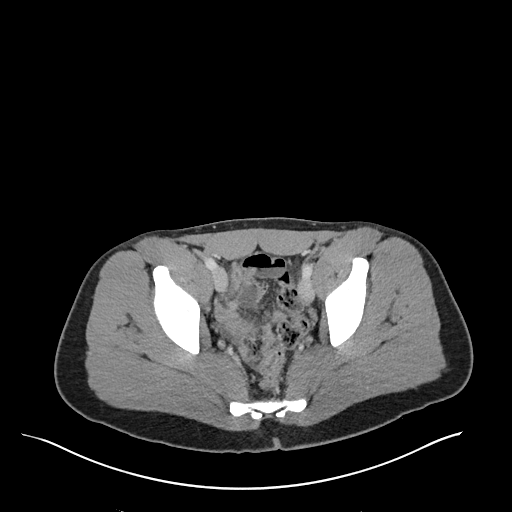
[im 42/101  soft-tissue]
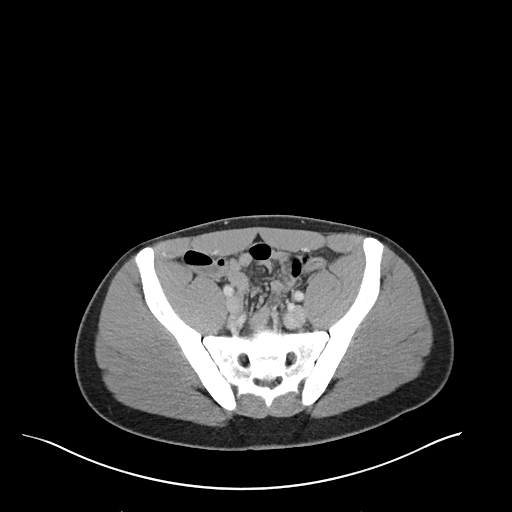
[im 51/101  soft-tissue]
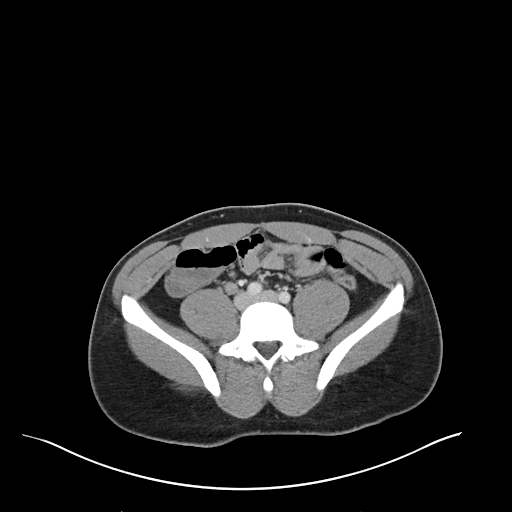
[im 59/101  soft-tissue]
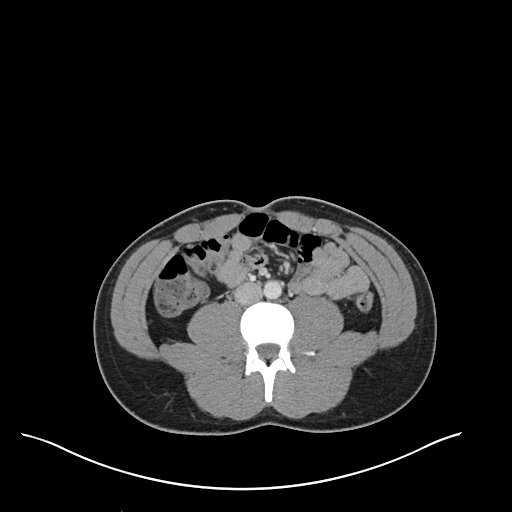
[im 67/101  soft-tissue]
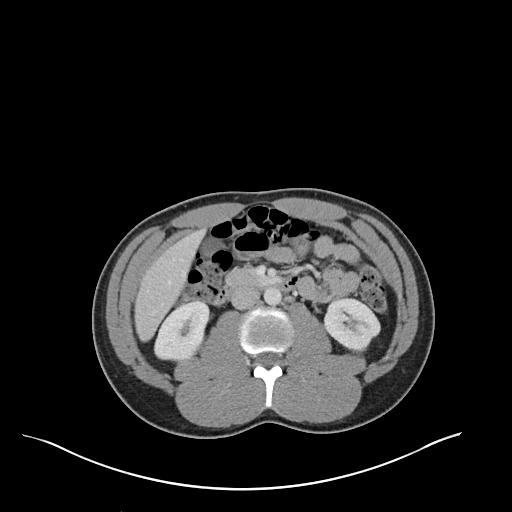
[im 67/101  bone]
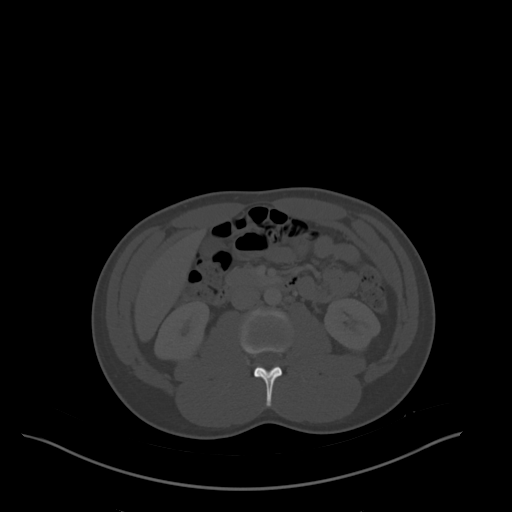
[im 71/101  soft-tissue]
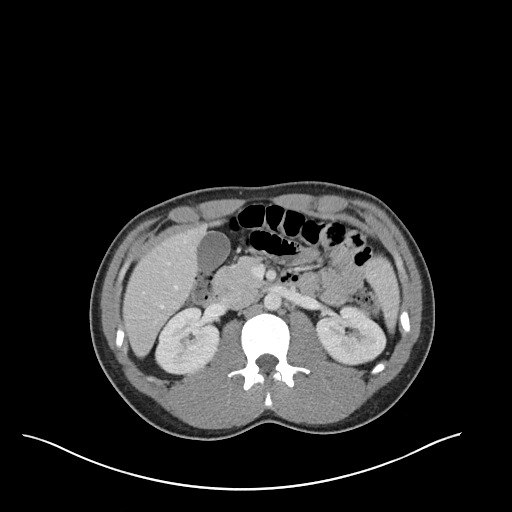
[im 80/101  soft-tissue]
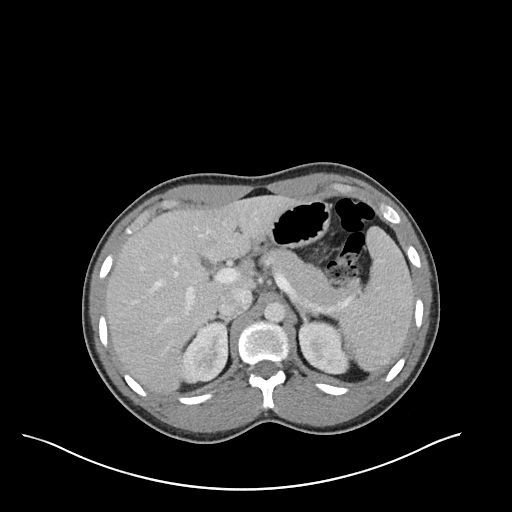
[im 88/101  soft-tissue]
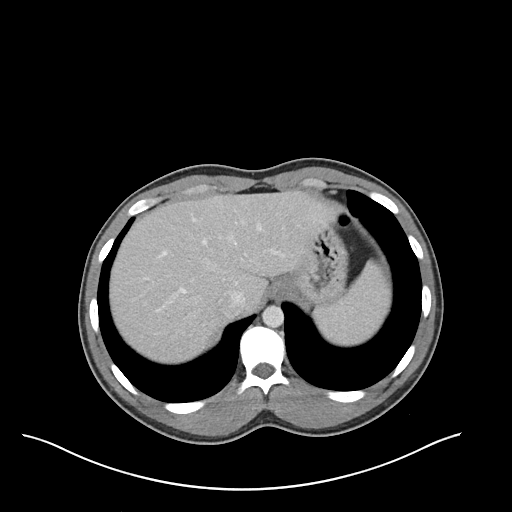
[im 96/101  soft-tissue]
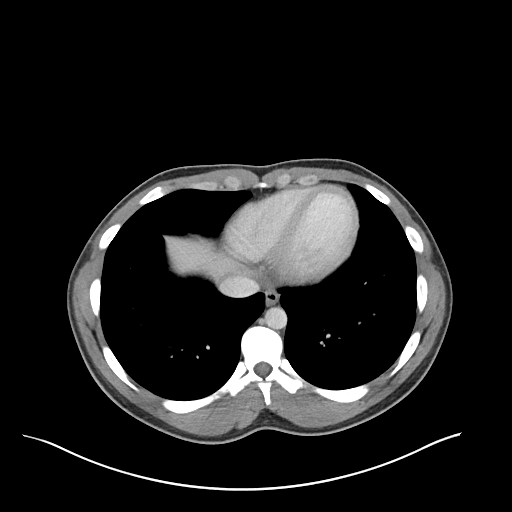

[Series 6: coronal soft tissue · coronal · 0.82mm/px · 3 of 100 slices shown]
[im 34/100  soft-tissue]
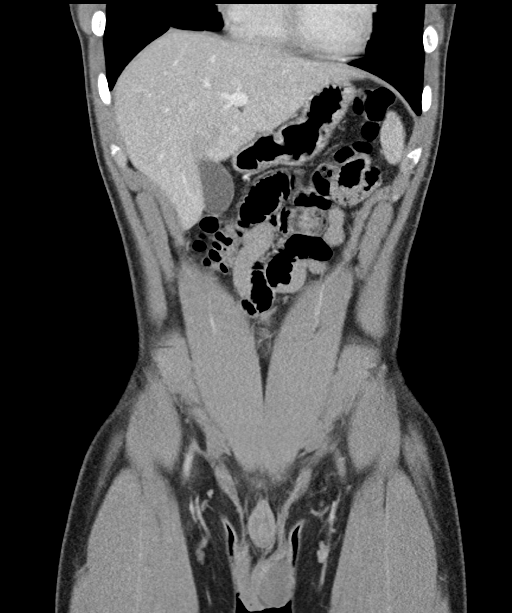
[im 45/100  soft-tissue]
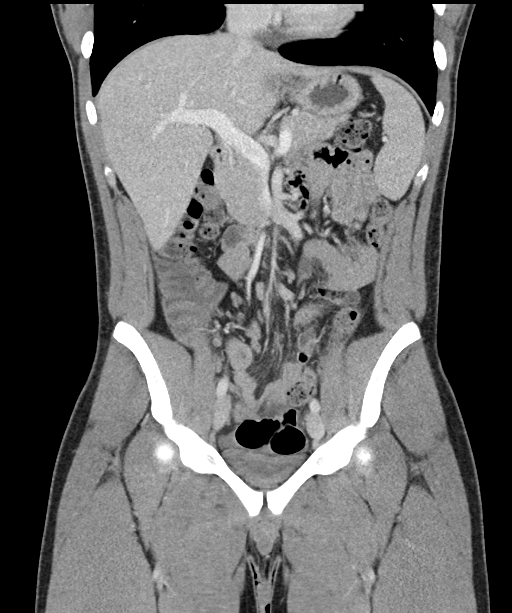
[im 56/100  soft-tissue]
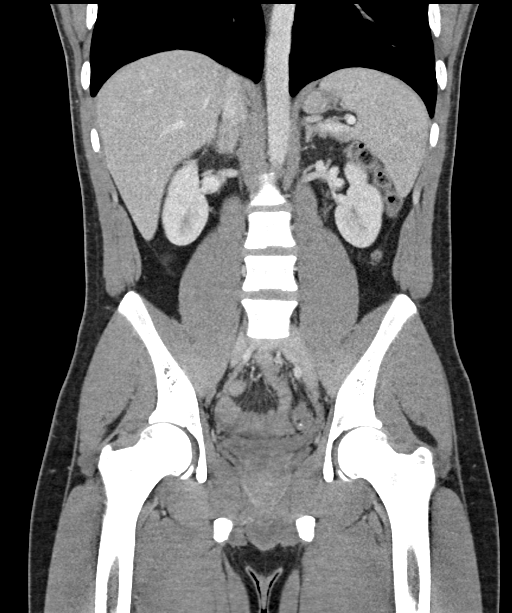

[16 of 46 positions shown; findings below may reference images not displayed]

FINDINGS: Lower chest: Lung bases clear

Hepatobiliary: Gallbladder and liver normal appearance

Pancreas: Normal appearance

Spleen: Normal appearance

Adrenals/Urinary Tract: Adrenal glands, kidneys, ureters, and
decompressed bladder normal appearance

Stomach/Bowel: Acute appendicitis:

Appendix: Location: Inferior to cecal tip into RIGHT pelvis

Diameter: 12 mm

Appendicolith: Absent

Mucosal hyper-enhancement: Mild

Extraluminal gas: None

Periappendiceal collection: Adjacent edema. Small fluid collection
medial to the appendix is identified, approximately 20 x 15 x 14 mm,
uncertain if represents a focal periappendiceal collection,
periappendiceal fluid between bowel loops, or fluid within a small
bowel loop.

Vascular/Lymphatic: Vascular structures unremarkable. No adenopathy.
Few normal sized mesenteric lymph nodes in RIGHT mid abdomen.

Reproductive: Unremarkable prostate gland and seminal vesicles

Other: Small amount of free fluid in pelvis. No free air. No hernia.

Musculoskeletal: Unremarkable
IMPRESSION: Acute appendicitis with periappendiceal infiltration and fluid as
well as free fluid in the pelvis.

A 20 x 15 x 14 mm diameter focal fluid collection is seen medial to
the appendix without significant wall thickening or surrounding
inflammation, may represent a small periappendiceal collection,
periappendiceal free fluid between bowel loops, or be related to
fluid in a bowel loop.

## 2019-10-02 ENCOUNTER — Other Ambulatory Visit: Payer: BC Managed Care – PPO

## 2020-08-22 ENCOUNTER — Emergency Department (HOSPITAL_COMMUNITY)
Admission: EM | Admit: 2020-08-22 | Discharge: 2020-08-22 | Disposition: A | Discharge status: Home / Self Care | Payer: BC Managed Care – PPO | Attending: Emergency Medicine | Admitting: Emergency Medicine

## 2020-08-22 ENCOUNTER — Other Ambulatory Visit: Payer: Self-pay

## 2020-08-22 ENCOUNTER — Encounter (HOSPITAL_COMMUNITY): Payer: Self-pay

## 2020-08-22 DIAGNOSIS — R519 Headache, unspecified: Secondary | ICD-10-CM | POA: Insufficient documentation

## 2020-08-22 DIAGNOSIS — J45909 Unspecified asthma, uncomplicated: Secondary | ICD-10-CM | POA: Insufficient documentation

## 2020-08-22 DIAGNOSIS — Z7722 Contact with and (suspected) exposure to environmental tobacco smoke (acute) (chronic): Secondary | ICD-10-CM | POA: Diagnosis not present

## 2020-08-22 DIAGNOSIS — Y9389 Activity, other specified: Secondary | ICD-10-CM | POA: Diagnosis not present

## 2020-08-22 DIAGNOSIS — M542 Cervicalgia: Secondary | ICD-10-CM | POA: Diagnosis not present

## 2020-08-22 DIAGNOSIS — E039 Hypothyroidism, unspecified: Secondary | ICD-10-CM | POA: Insufficient documentation

## 2020-08-22 DIAGNOSIS — Y92410 Unspecified street and highway as the place of occurrence of the external cause: Secondary | ICD-10-CM | POA: Diagnosis not present

## 2020-08-22 MED ORDER — NAPROXEN 500 MG PO TABS: 500.0000 mg | ORAL_TABLET | Freq: Two times a day (BID) | ORAL | 0 refills | Status: AC

## 2020-08-22 MED ORDER — TIZANIDINE HCL 2 MG PO CAPS: 2.0000 mg | ORAL_CAPSULE | Freq: Three times a day (TID) | ORAL | 0 refills | Status: AC
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
